# Patient Record
Sex: Female | Born: 1935 | Race: White | Hispanic: No | Marital: Married | Smoking: Former smoker
Health system: Southern US, Community
[De-identification: ages and names within clinical notes are randomized; demographics above are authoritative.]

## PROBLEM LIST (undated history)

## (undated) DIAGNOSIS — K5900 Constipation, unspecified: Secondary | ICD-10-CM

## (undated) DIAGNOSIS — B0223 Postherpetic polyneuropathy: Secondary | ICD-10-CM

## (undated) DIAGNOSIS — R339 Retention of urine, unspecified: Secondary | ICD-10-CM

## (undated) DIAGNOSIS — R1312 Dysphagia, oropharyngeal phase: Secondary | ICD-10-CM

## (undated) DIAGNOSIS — I2510 Atherosclerotic heart disease of native coronary artery without angina pectoris: Secondary | ICD-10-CM

## (undated) DIAGNOSIS — S62109A Fracture of unspecified carpal bone, unspecified wrist, initial encounter for closed fracture: Secondary | ICD-10-CM

## (undated) DIAGNOSIS — M1990 Unspecified osteoarthritis, unspecified site: Secondary | ICD-10-CM

## (undated) DIAGNOSIS — Z79899 Other long term (current) drug therapy: Secondary | ICD-10-CM

## (undated) DIAGNOSIS — R42 Dizziness and giddiness: Secondary | ICD-10-CM

## (undated) DIAGNOSIS — M5020 Other cervical disc displacement, unspecified cervical region: Secondary | ICD-10-CM

## (undated) DIAGNOSIS — R32 Unspecified urinary incontinence: Secondary | ICD-10-CM

## (undated) DIAGNOSIS — K5730 Diverticulosis of large intestine without perforation or abscess without bleeding: Secondary | ICD-10-CM

## (undated) DIAGNOSIS — I517 Cardiomegaly: Secondary | ICD-10-CM

## (undated) DIAGNOSIS — F0280 Dementia in other diseases classified elsewhere without behavioral disturbance: Secondary | ICD-10-CM

## (undated) DIAGNOSIS — M25569 Pain in unspecified knee: Secondary | ICD-10-CM

## (undated) DIAGNOSIS — J301 Allergic rhinitis due to pollen: Secondary | ICD-10-CM

## (undated) DIAGNOSIS — H9190 Unspecified hearing loss, unspecified ear: Secondary | ICD-10-CM

## (undated) DIAGNOSIS — K1321 Leukoplakia of oral mucosa, including tongue: Secondary | ICD-10-CM

## (undated) DIAGNOSIS — G309 Alzheimer's disease, unspecified: Secondary | ICD-10-CM

## (undated) DIAGNOSIS — I6789 Other cerebrovascular disease: Secondary | ICD-10-CM

## (undated) DIAGNOSIS — F332 Major depressive disorder, recurrent severe without psychotic features: Secondary | ICD-10-CM

## (undated) DIAGNOSIS — E1165 Type 2 diabetes mellitus with hyperglycemia: Secondary | ICD-10-CM

## (undated) DIAGNOSIS — F411 Generalized anxiety disorder: Secondary | ICD-10-CM

## (undated) DIAGNOSIS — K219 Gastro-esophageal reflux disease without esophagitis: Secondary | ICD-10-CM

## (undated) DIAGNOSIS — M545 Low back pain: Secondary | ICD-10-CM

## (undated) DIAGNOSIS — H3530 Unspecified macular degeneration: Secondary | ICD-10-CM

## (undated) DIAGNOSIS — G4700 Insomnia, unspecified: Secondary | ICD-10-CM

## (undated) DIAGNOSIS — E119 Type 2 diabetes mellitus without complications: Secondary | ICD-10-CM

## (undated) DIAGNOSIS — L280 Lichen simplex chronicus: Secondary | ICD-10-CM

## (undated) DIAGNOSIS — I10 Essential (primary) hypertension: Secondary | ICD-10-CM

## (undated) DIAGNOSIS — R609 Edema, unspecified: Secondary | ICD-10-CM

## (undated) DIAGNOSIS — E550 Rickets, active: Secondary | ICD-10-CM

## (undated) DIAGNOSIS — R12 Heartburn: Secondary | ICD-10-CM

## (undated) DIAGNOSIS — M810 Age-related osteoporosis without current pathological fracture: Secondary | ICD-10-CM

## (undated) DIAGNOSIS — Z9181 History of falling: Secondary | ICD-10-CM

## (undated) DIAGNOSIS — E7800 Pure hypercholesterolemia, unspecified: Secondary | ICD-10-CM

## (undated) DIAGNOSIS — L738 Other specified follicular disorders: Secondary | ICD-10-CM

## (undated) DIAGNOSIS — I319 Disease of pericardium, unspecified: Secondary | ICD-10-CM

## (undated) DIAGNOSIS — J690 Pneumonitis due to inhalation of food and vomit: Secondary | ICD-10-CM

## (undated) DIAGNOSIS — J449 Chronic obstructive pulmonary disease, unspecified: Secondary | ICD-10-CM

## (undated) DIAGNOSIS — M79609 Pain in unspecified limb: Secondary | ICD-10-CM

## (undated) DIAGNOSIS — R269 Unspecified abnormalities of gait and mobility: Secondary | ICD-10-CM

## (undated) DIAGNOSIS — D509 Iron deficiency anemia, unspecified: Secondary | ICD-10-CM

## (undated) DIAGNOSIS — S0181XA Laceration without foreign body of other part of head, initial encounter: Secondary | ICD-10-CM

## (undated) DIAGNOSIS — M6281 Muscle weakness (generalized): Secondary | ICD-10-CM

## (undated) HISTORY — DX: Fracture of unspecified carpal bone, unspecified wrist, initial encounter for closed fracture: S62.109A

## (undated) HISTORY — DX: History of falling: Z91.81

## (undated) HISTORY — DX: Major depressive disorder, recurrent severe without psychotic features: F33.2

## (undated) HISTORY — DX: Insomnia, unspecified: G47.00

## (undated) HISTORY — DX: Generalized anxiety disorder: F41.1

## (undated) HISTORY — DX: Heartburn: R12

## (undated) HISTORY — DX: Other long term (current) drug therapy: Z79.899

## (undated) HISTORY — PX: APPENDECTOMY: SHX54

## (undated) HISTORY — DX: Atherosclerotic heart disease of native coronary artery without angina pectoris: I25.10

## (undated) HISTORY — DX: Dizziness and giddiness: R42

## (undated) HISTORY — DX: Other specified follicular disorders: L73.8

## (undated) HISTORY — DX: Unspecified abnormalities of gait and mobility: R26.9

## (undated) HISTORY — DX: Retention of urine, unspecified: R33.9

## (undated) HISTORY — DX: Pain in unspecified knee: M25.569

## (undated) HISTORY — DX: Other cerebrovascular disease: I67.89

## (undated) HISTORY — DX: Lichen simplex chronicus: L28.0

## (undated) HISTORY — DX: Age-related osteoporosis without current pathological fracture: M81.0

## (undated) HISTORY — DX: Other cervical disc displacement, unspecified cervical region: M50.20

## (undated) HISTORY — DX: Unspecified macular degeneration: H35.30

## (undated) HISTORY — DX: Rickets, active: E55.0

## (undated) HISTORY — DX: Pneumonitis due to inhalation of food and vomit: J69.0

## (undated) HISTORY — DX: Pure hypercholesterolemia, unspecified: E78.00

## (undated) HISTORY — DX: Type 2 diabetes mellitus with hyperglycemia: E11.65

## (undated) HISTORY — DX: Muscle weakness (generalized): M62.81

## (undated) HISTORY — DX: Essential (primary) hypertension: I10

## (undated) HISTORY — DX: Allergic rhinitis due to pollen: J30.1

## (undated) HISTORY — DX: Type 2 diabetes mellitus without complications: E11.9

## (undated) HISTORY — DX: Unspecified urinary incontinence: R32

## (undated) HISTORY — DX: Laceration without foreign body of other part of head, initial encounter: S01.81XA

## (undated) HISTORY — DX: Edema, unspecified: R60.9

## (undated) HISTORY — DX: Iron deficiency anemia, unspecified: D50.9

## (undated) HISTORY — DX: Chronic obstructive pulmonary disease, unspecified: J44.9

## (undated) HISTORY — DX: Disease of pericardium, unspecified: I31.9

## (undated) HISTORY — DX: Pain in unspecified limb: M79.609

## (undated) HISTORY — DX: Gastro-esophageal reflux disease without esophagitis: K21.9

## (undated) HISTORY — DX: Alzheimer's disease, unspecified: G30.9

## (undated) HISTORY — DX: Unspecified hearing loss, unspecified ear: H91.90

## (undated) HISTORY — DX: Low back pain: M54.5

## (undated) HISTORY — DX: Constipation, unspecified: K59.00

## (undated) HISTORY — DX: Unspecified osteoarthritis, unspecified site: M19.90

## (undated) HISTORY — PX: TONSILLECTOMY: SUR1361

## (undated) HISTORY — DX: Dysphagia, oropharyngeal phase: R13.12

## (undated) HISTORY — DX: Cardiomegaly: I51.7

## (undated) HISTORY — PX: CARPAL TUNNEL RELEASE: SHX101

## (undated) HISTORY — DX: Dementia in other diseases classified elsewhere without behavioral disturbance: F02.80

## (undated) HISTORY — DX: Diverticulosis of large intestine without perforation or abscess without bleeding: K57.30

## (undated) HISTORY — PX: EYE SURGERY: SHX253

## (undated) HISTORY — DX: Leukoplakia of oral mucosa, including tongue: K13.21

## (undated) HISTORY — DX: Postherpetic polyneuropathy: B02.23

---

## 1997-11-11 ENCOUNTER — Other Ambulatory Visit: Admission: RE | Admit: 1997-11-11 | Discharge: 1997-11-11 | Payer: Self-pay | Admitting: Internal Medicine

## 1998-06-27 ENCOUNTER — Encounter: Payer: Self-pay | Admitting: Gastroenterology

## 1998-06-27 ENCOUNTER — Ambulatory Visit: Admission: RE | Admit: 1998-06-27 | Discharge: 1998-06-27 | Payer: Self-pay | Admitting: Gastroenterology

## 1998-11-16 ENCOUNTER — Encounter: Admission: RE | Admit: 1998-11-16 | Discharge: 1999-02-14 | Payer: Self-pay | Admitting: Internal Medicine

## 1999-01-08 ENCOUNTER — Other Ambulatory Visit: Admission: RE | Admit: 1999-01-08 | Discharge: 1999-01-08 | Payer: Self-pay | Admitting: Internal Medicine

## 1999-01-27 ENCOUNTER — Encounter: Admission: RE | Admit: 1999-01-27 | Discharge: 1999-01-27 | Payer: Self-pay | Admitting: Internal Medicine

## 1999-01-27 ENCOUNTER — Encounter: Payer: Self-pay | Admitting: Internal Medicine

## 1999-09-26 ENCOUNTER — Emergency Department (HOSPITAL_COMMUNITY): Admission: EM | Admit: 1999-09-26 | Discharge: 1999-09-26 | Payer: Self-pay | Admitting: Emergency Medicine

## 1999-10-13 ENCOUNTER — Emergency Department (HOSPITAL_COMMUNITY): Admission: EM | Admit: 1999-10-13 | Discharge: 1999-10-13 | Payer: Self-pay

## 1999-10-13 ENCOUNTER — Encounter: Payer: Self-pay | Admitting: Emergency Medicine

## 1999-12-02 ENCOUNTER — Emergency Department (HOSPITAL_COMMUNITY): Admission: EM | Admit: 1999-12-02 | Discharge: 1999-12-02 | Payer: Self-pay | Admitting: Emergency Medicine

## 2000-06-15 ENCOUNTER — Other Ambulatory Visit: Admission: RE | Admit: 2000-06-15 | Discharge: 2000-06-15 | Payer: Self-pay | Admitting: *Deleted

## 2000-11-27 ENCOUNTER — Encounter: Admission: RE | Admit: 2000-11-27 | Discharge: 2000-11-27 | Payer: Self-pay | Admitting: *Deleted

## 2001-12-24 ENCOUNTER — Other Ambulatory Visit: Admission: RE | Admit: 2001-12-24 | Discharge: 2001-12-24 | Payer: Self-pay | Admitting: Obstetrics and Gynecology

## 2002-01-09 ENCOUNTER — Encounter: Payer: Self-pay | Admitting: Obstetrics and Gynecology

## 2002-01-09 ENCOUNTER — Encounter: Admission: RE | Admit: 2002-01-09 | Discharge: 2002-01-09 | Payer: Self-pay | Admitting: Obstetrics and Gynecology

## 2002-04-15 DIAGNOSIS — L738 Other specified follicular disorders: Secondary | ICD-10-CM

## 2002-04-15 HISTORY — DX: Other specified follicular disorders: L73.8

## 2002-07-03 DIAGNOSIS — I10 Essential (primary) hypertension: Secondary | ICD-10-CM

## 2002-07-03 HISTORY — DX: Essential (primary) hypertension: I10

## 2003-03-01 HISTORY — PX: AXILLARY ARTERY - FEMORAL ARTERY BYPASS GRAFT: SUR177

## 2003-03-11 DIAGNOSIS — H9190 Unspecified hearing loss, unspecified ear: Secondary | ICD-10-CM | POA: Insufficient documentation

## 2003-03-11 HISTORY — DX: Unspecified hearing loss, unspecified ear: H91.90

## 2003-04-25 ENCOUNTER — Encounter: Admission: RE | Admit: 2003-04-25 | Discharge: 2003-04-25 | Payer: Self-pay | Admitting: Obstetrics and Gynecology

## 2003-07-16 DIAGNOSIS — H3530 Unspecified macular degeneration: Secondary | ICD-10-CM | POA: Insufficient documentation

## 2003-07-16 DIAGNOSIS — I2510 Atherosclerotic heart disease of native coronary artery without angina pectoris: Secondary | ICD-10-CM

## 2003-07-16 DIAGNOSIS — I6789 Other cerebrovascular disease: Secondary | ICD-10-CM

## 2003-07-16 DIAGNOSIS — J449 Chronic obstructive pulmonary disease, unspecified: Secondary | ICD-10-CM

## 2003-07-16 DIAGNOSIS — E7800 Pure hypercholesterolemia, unspecified: Secondary | ICD-10-CM

## 2003-07-16 DIAGNOSIS — F411 Generalized anxiety disorder: Secondary | ICD-10-CM

## 2003-07-16 DIAGNOSIS — K219 Gastro-esophageal reflux disease without esophagitis: Secondary | ICD-10-CM

## 2003-07-16 DIAGNOSIS — M810 Age-related osteoporosis without current pathological fracture: Secondary | ICD-10-CM

## 2003-07-16 DIAGNOSIS — M5020 Other cervical disc displacement, unspecified cervical region: Secondary | ICD-10-CM

## 2003-07-16 DIAGNOSIS — M1990 Unspecified osteoarthritis, unspecified site: Secondary | ICD-10-CM

## 2003-07-16 DIAGNOSIS — J4489 Other specified chronic obstructive pulmonary disease: Secondary | ICD-10-CM

## 2003-07-16 HISTORY — DX: Unspecified osteoarthritis, unspecified site: M19.90

## 2003-07-16 HISTORY — DX: Other specified chronic obstructive pulmonary disease: J44.89

## 2003-07-16 HISTORY — DX: Generalized anxiety disorder: F41.1

## 2003-07-16 HISTORY — DX: Pure hypercholesterolemia, unspecified: E78.00

## 2003-07-16 HISTORY — DX: Gastro-esophageal reflux disease without esophagitis: K21.9

## 2003-07-16 HISTORY — DX: Atherosclerotic heart disease of native coronary artery without angina pectoris: I25.10

## 2003-07-16 HISTORY — DX: Other cerebrovascular disease: I67.89

## 2003-07-16 HISTORY — DX: Age-related osteoporosis without current pathological fracture: M81.0

## 2003-07-16 HISTORY — DX: Unspecified macular degeneration: H35.30

## 2003-07-16 HISTORY — DX: Chronic obstructive pulmonary disease, unspecified: J44.9

## 2003-07-16 HISTORY — DX: Other cervical disc displacement, unspecified cervical region: M50.20

## 2003-08-19 ENCOUNTER — Ambulatory Visit (HOSPITAL_COMMUNITY): Admission: RE | Admit: 2003-08-19 | Discharge: 2003-08-20 | Payer: Self-pay | Admitting: Ophthalmology

## 2003-09-03 DIAGNOSIS — M79609 Pain in unspecified limb: Secondary | ICD-10-CM

## 2003-09-03 HISTORY — DX: Pain in unspecified limb: M79.609

## 2003-09-24 DIAGNOSIS — G4700 Insomnia, unspecified: Secondary | ICD-10-CM

## 2003-09-24 DIAGNOSIS — R42 Dizziness and giddiness: Secondary | ICD-10-CM

## 2003-09-24 HISTORY — DX: Dizziness and giddiness: R42

## 2003-09-24 HISTORY — DX: Insomnia, unspecified: G47.00

## 2003-10-02 ENCOUNTER — Ambulatory Visit (HOSPITAL_COMMUNITY): Admission: RE | Admit: 2003-10-02 | Discharge: 2003-10-02 | Payer: Self-pay | Admitting: Cardiovascular Disease

## 2003-10-07 ENCOUNTER — Ambulatory Visit (HOSPITAL_COMMUNITY): Admission: RE | Admit: 2003-10-07 | Discharge: 2003-10-07 | Payer: Self-pay | Admitting: Cardiovascular Disease

## 2003-10-20 ENCOUNTER — Inpatient Hospital Stay (HOSPITAL_COMMUNITY): Admission: AD | Admit: 2003-10-20 | Discharge: 2003-10-24 | Payer: Self-pay | Admitting: Cardiovascular Disease

## 2003-11-18 ENCOUNTER — Ambulatory Visit: Admission: RE | Admit: 2003-11-18 | Discharge: 2003-11-18 | Payer: Self-pay | Admitting: Vascular Surgery

## 2003-11-25 ENCOUNTER — Inpatient Hospital Stay (HOSPITAL_COMMUNITY): Admission: RE | Admit: 2003-11-25 | Discharge: 2003-12-01 | Payer: Self-pay | Admitting: Vascular Surgery

## 2004-01-01 ENCOUNTER — Ambulatory Visit (HOSPITAL_COMMUNITY): Admission: RE | Admit: 2004-01-01 | Discharge: 2004-01-01 | Payer: Self-pay | Admitting: Vascular Surgery

## 2004-01-16 DIAGNOSIS — M6281 Muscle weakness (generalized): Secondary | ICD-10-CM

## 2004-01-16 HISTORY — DX: Muscle weakness (generalized): M62.81

## 2004-02-03 ENCOUNTER — Inpatient Hospital Stay (HOSPITAL_COMMUNITY): Admission: AD | Admit: 2004-02-03 | Discharge: 2004-02-06 | Payer: Self-pay | Admitting: *Deleted

## 2004-02-03 ENCOUNTER — Encounter (INDEPENDENT_AMBULATORY_CARE_PROVIDER_SITE_OTHER): Payer: Self-pay | Admitting: *Deleted

## 2004-02-06 HISTORY — PX: COLONOSCOPY: SHX174

## 2004-02-16 ENCOUNTER — Other Ambulatory Visit: Admission: RE | Admit: 2004-02-16 | Discharge: 2004-02-16 | Payer: Self-pay | Admitting: *Deleted

## 2004-02-28 ENCOUNTER — Inpatient Hospital Stay (HOSPITAL_COMMUNITY): Admission: EM | Admit: 2004-02-28 | Discharge: 2004-03-09 | Payer: Self-pay | Admitting: Emergency Medicine

## 2004-08-18 DIAGNOSIS — M545 Low back pain, unspecified: Secondary | ICD-10-CM

## 2004-08-18 HISTORY — DX: Low back pain, unspecified: M54.50

## 2004-10-20 DIAGNOSIS — B0223 Postherpetic polyneuropathy: Secondary | ICD-10-CM

## 2004-10-20 HISTORY — DX: Postherpetic polyneuropathy: B02.23

## 2004-12-20 DIAGNOSIS — R609 Edema, unspecified: Secondary | ICD-10-CM

## 2004-12-20 HISTORY — DX: Edema, unspecified: R60.9

## 2005-01-03 ENCOUNTER — Encounter: Admission: RE | Admit: 2005-01-03 | Discharge: 2005-01-03 | Payer: Self-pay | Admitting: Internal Medicine

## 2005-02-16 ENCOUNTER — Other Ambulatory Visit: Admission: RE | Admit: 2005-02-16 | Discharge: 2005-02-16 | Payer: Self-pay | Admitting: Obstetrics and Gynecology

## 2005-06-13 DIAGNOSIS — IMO0001 Reserved for inherently not codable concepts without codable children: Secondary | ICD-10-CM

## 2005-06-13 HISTORY — DX: Reserved for inherently not codable concepts without codable children: IMO0001

## 2005-10-29 IMAGING — CR DG CHEST 2V
2 series · 2 of 2 positions shown · non-contrast
Comparison: none

CLINICAL DATA: 88-year-old female ? preadmission respiratory evaluation for cataract surgery.  History of smoking, positive PPD and hypertension.  
CHEST (TWO VIEWS) 08/15/03 AT 9192 HOURS

[view not recorded (1 of 2)]
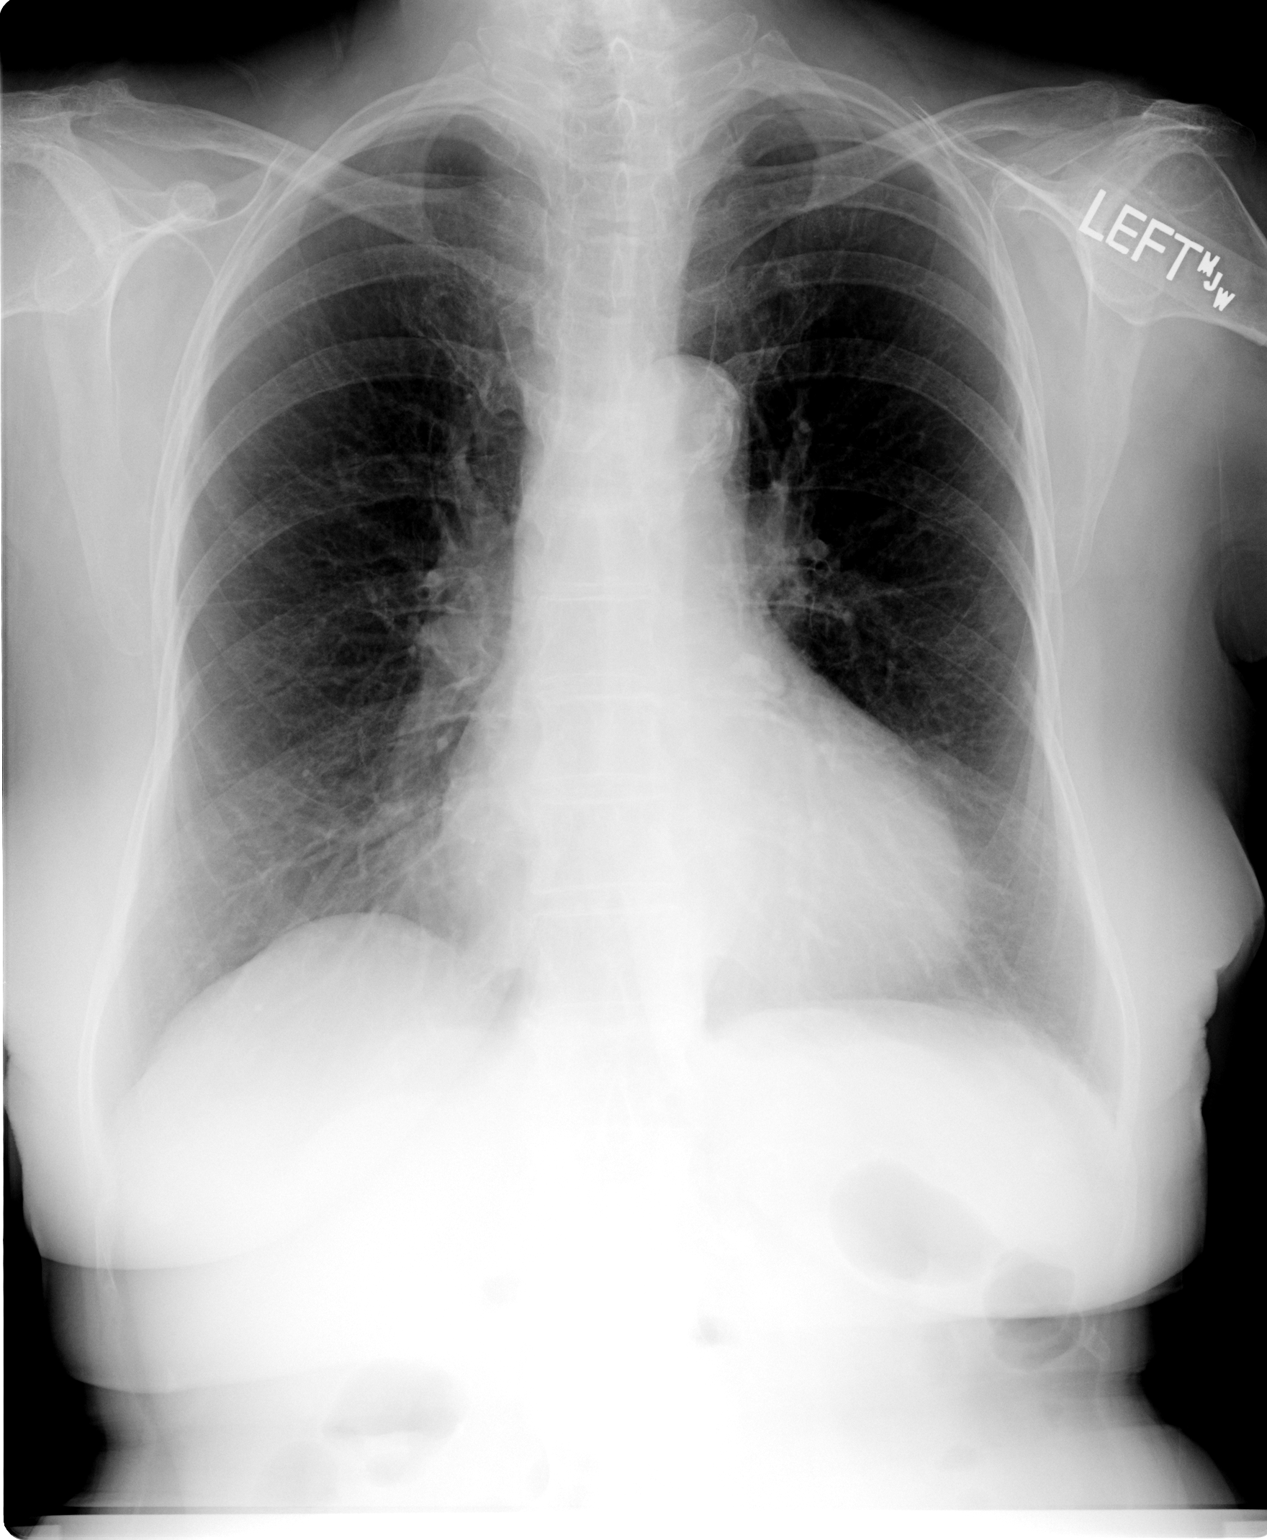

[view not recorded (2 of 2)]
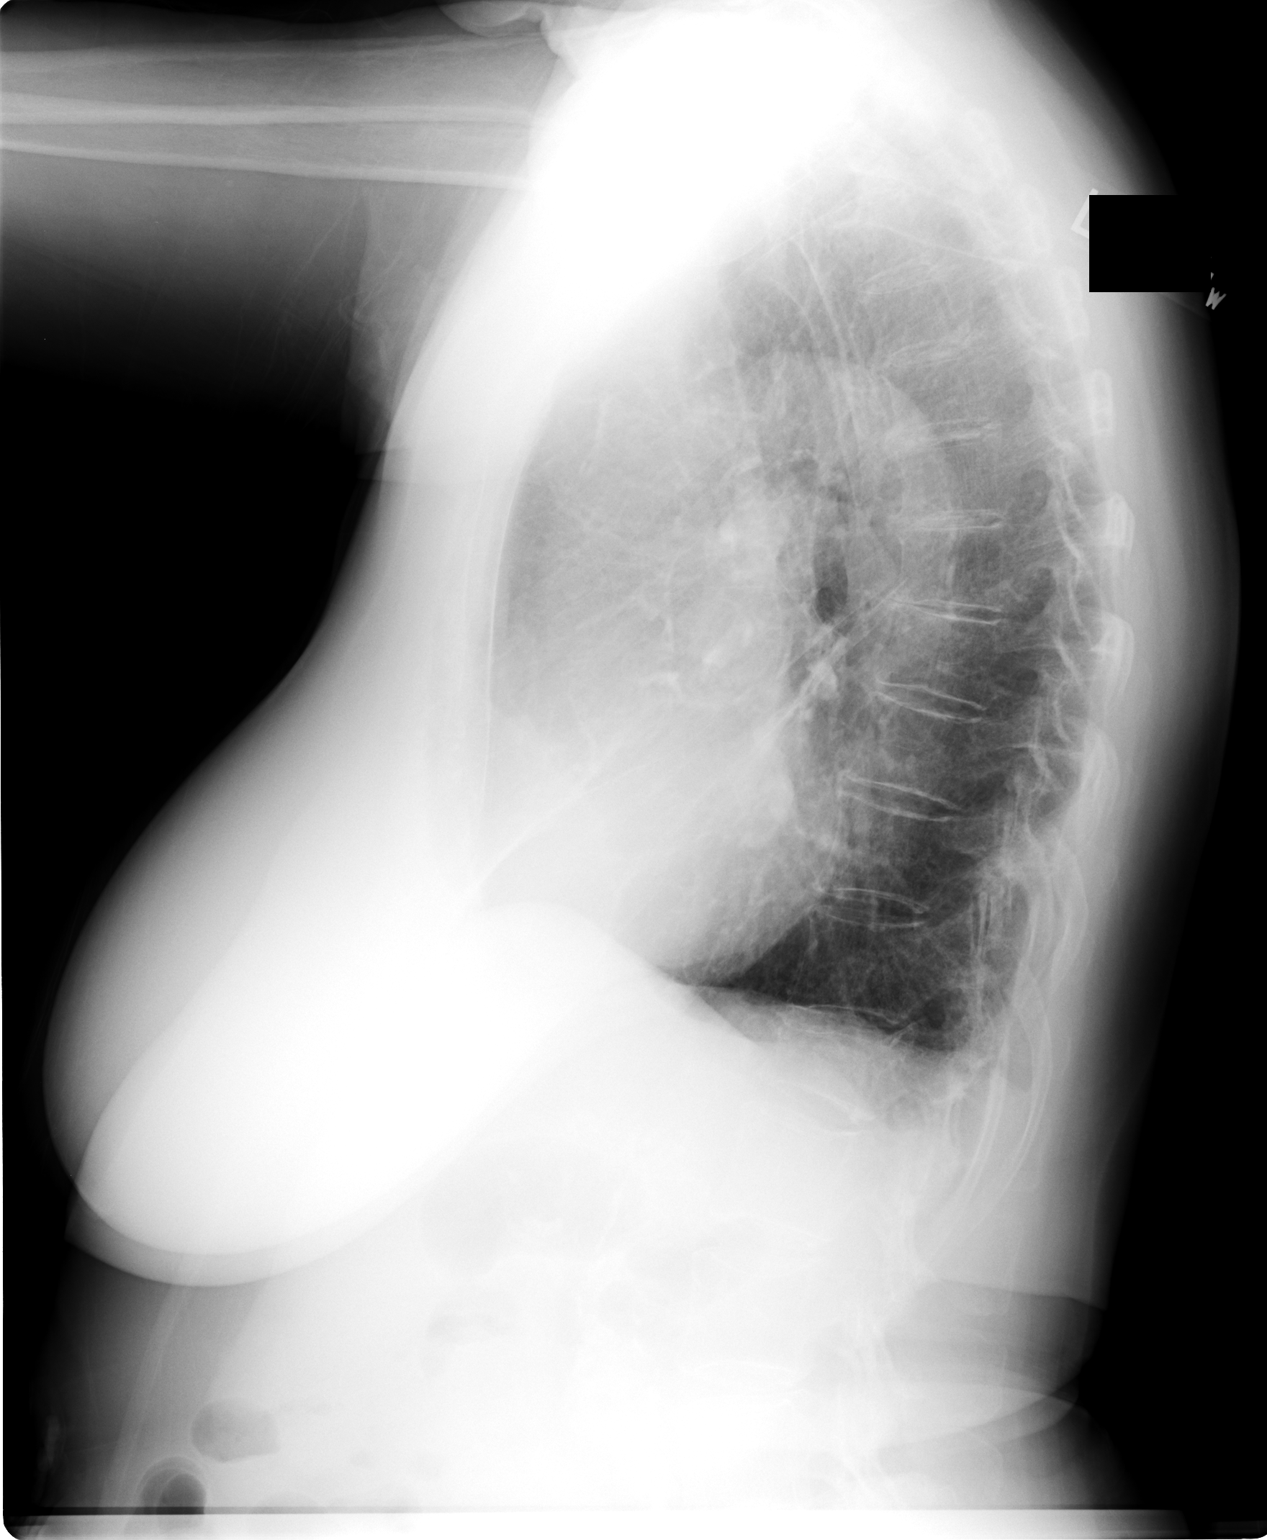

[2 of 2 positions shown; findings below may reference images not displayed]

FINDINGS: There is mild cardiac enlargement without CHF, pneumonia, effusion or pneumothorax.  Mild hyperinflation is evident of the chest consistent with background COPD.  Left infrahilar calcifications are suspected consistent with remote granulomatous disease. 
Degenerative changes are present in the thoracic spine with an L1 compression fracture, age indeterminate.  
IMPRESSION
COPD. 
Suspect remote granulomatous disease with calcified granulomas in the left infrahilar region. 
No acute airspace disease. 
L1 compression fracture, age indeterminate.

## 2005-12-04 ENCOUNTER — Emergency Department (HOSPITAL_COMMUNITY): Admission: EM | Admit: 2005-12-04 | Discharge: 2005-12-04 | Payer: Self-pay | Admitting: Family Medicine

## 2006-01-10 ENCOUNTER — Encounter: Admission: RE | Admit: 2006-01-10 | Discharge: 2006-01-10 | Payer: Self-pay | Admitting: Internal Medicine

## 2006-01-10 DIAGNOSIS — K5900 Constipation, unspecified: Secondary | ICD-10-CM

## 2006-01-10 DIAGNOSIS — R12 Heartburn: Secondary | ICD-10-CM

## 2006-01-10 HISTORY — DX: Heartburn: R12

## 2006-01-10 HISTORY — DX: Constipation, unspecified: K59.00

## 2006-02-17 ENCOUNTER — Other Ambulatory Visit: Admission: RE | Admit: 2006-02-17 | Discharge: 2006-02-17 | Payer: Self-pay | Admitting: Obstetrics & Gynecology

## 2006-02-20 DIAGNOSIS — K5730 Diverticulosis of large intestine without perforation or abscess without bleeding: Secondary | ICD-10-CM

## 2006-02-20 HISTORY — DX: Diverticulosis of large intestine without perforation or abscess without bleeding: K57.30

## 2006-04-03 DIAGNOSIS — F0280 Dementia in other diseases classified elsewhere without behavioral disturbance: Secondary | ICD-10-CM

## 2006-04-03 HISTORY — DX: Dementia in other diseases classified elsewhere, unspecified severity, without behavioral disturbance, psychotic disturbance, mood disturbance, and anxiety: F02.80

## 2006-12-18 DIAGNOSIS — F332 Major depressive disorder, recurrent severe without psychotic features: Secondary | ICD-10-CM

## 2006-12-18 HISTORY — DX: Major depressive disorder, recurrent severe without psychotic features: F33.2

## 2007-01-01 DIAGNOSIS — K1321 Leukoplakia of oral mucosa, including tongue: Secondary | ICD-10-CM

## 2007-01-01 HISTORY — DX: Leukoplakia of oral mucosa, including tongue: K13.21

## 2007-05-08 ENCOUNTER — Ambulatory Visit: Payer: Self-pay | Admitting: Vascular Surgery

## 2007-07-18 DIAGNOSIS — L280 Lichen simplex chronicus: Secondary | ICD-10-CM

## 2007-07-18 DIAGNOSIS — J301 Allergic rhinitis due to pollen: Secondary | ICD-10-CM

## 2007-07-18 DIAGNOSIS — R32 Unspecified urinary incontinence: Secondary | ICD-10-CM

## 2007-07-18 HISTORY — DX: Unspecified urinary incontinence: R32

## 2007-07-18 HISTORY — DX: Allergic rhinitis due to pollen: J30.1

## 2007-07-18 HISTORY — DX: Lichen simplex chronicus: L28.0

## 2008-02-08 DIAGNOSIS — E550 Rickets, active: Secondary | ICD-10-CM

## 2008-02-08 HISTORY — DX: Rickets, active: E55.0

## 2008-08-18 DIAGNOSIS — E119 Type 2 diabetes mellitus without complications: Secondary | ICD-10-CM

## 2008-08-18 HISTORY — DX: Type 2 diabetes mellitus without complications: E11.9

## 2009-06-17 DIAGNOSIS — M25569 Pain in unspecified knee: Secondary | ICD-10-CM

## 2009-06-17 HISTORY — DX: Pain in unspecified knee: M25.569

## 2009-07-29 DIAGNOSIS — Z9181 History of falling: Secondary | ICD-10-CM

## 2009-07-29 DIAGNOSIS — D509 Iron deficiency anemia, unspecified: Secondary | ICD-10-CM

## 2009-07-29 HISTORY — DX: Iron deficiency anemia, unspecified: D50.9

## 2009-07-29 HISTORY — DX: History of falling: Z91.81

## 2009-08-24 DIAGNOSIS — R269 Unspecified abnormalities of gait and mobility: Secondary | ICD-10-CM

## 2009-08-24 HISTORY — DX: Unspecified abnormalities of gait and mobility: R26.9

## 2010-03-21 ENCOUNTER — Encounter: Payer: Self-pay | Admitting: Obstetrics & Gynecology

## 2010-04-06 ENCOUNTER — Inpatient Hospital Stay (HOSPITAL_COMMUNITY)
Admission: EM | Admit: 2010-04-06 | Discharge: 2010-04-08 | DRG: 178 | Disposition: A | Discharge status: Skilled Nursing Facility | Payer: MEDICARE | Attending: Internal Medicine | Admitting: Internal Medicine

## 2010-04-06 ENCOUNTER — Emergency Department (HOSPITAL_COMMUNITY): Payer: MEDICARE

## 2010-04-06 DIAGNOSIS — F0390 Unspecified dementia without behavioral disturbance: Secondary | ICD-10-CM | POA: Diagnosis present

## 2010-04-06 DIAGNOSIS — R1310 Dysphagia, unspecified: Secondary | ICD-10-CM | POA: Diagnosis present

## 2010-04-06 DIAGNOSIS — Z87440 Personal history of urinary (tract) infections: Secondary | ICD-10-CM

## 2010-04-06 DIAGNOSIS — M810 Age-related osteoporosis without current pathological fracture: Secondary | ICD-10-CM | POA: Diagnosis present

## 2010-04-06 DIAGNOSIS — J690 Pneumonitis due to inhalation of food and vomit: Secondary | ICD-10-CM

## 2010-04-06 DIAGNOSIS — J9819 Other pulmonary collapse: Secondary | ICD-10-CM | POA: Diagnosis present

## 2010-04-06 DIAGNOSIS — Z7902 Long term (current) use of antithrombotics/antiplatelets: Secondary | ICD-10-CM

## 2010-04-06 DIAGNOSIS — I10 Essential (primary) hypertension: Secondary | ICD-10-CM | POA: Diagnosis present

## 2010-04-06 HISTORY — DX: Pneumonitis due to inhalation of food and vomit: J69.0

## 2010-04-06 LAB — DIFFERENTIAL
Basophils Absolute: 0 10*3/uL (ref 0.0–0.1)
Basophils Relative: 0 % (ref 0–1)
Lymphocytes Relative: 7 % — ABNORMAL LOW (ref 12–46)
Lymphs Abs: 1.1 10*3/uL (ref 0.7–4.0)
Neutro Abs: 14 10*3/uL — ABNORMAL HIGH (ref 1.7–7.7)
Neutrophils Relative %: 85 % — ABNORMAL HIGH (ref 43–77)

## 2010-04-06 LAB — COMPREHENSIVE METABOLIC PANEL
AST: 23 U/L (ref 0–37)
Albumin: 3.6 g/dL (ref 3.5–5.2)
BUN: 9 mg/dL (ref 6–23)
Calcium: 8.8 mg/dL (ref 8.4–10.5)
Chloride: 108 mEq/L (ref 96–112)
Creatinine, Ser: 0.96 mg/dL (ref 0.4–1.2)
GFR calc Af Amer: >60 mL/min (ref >60)
GFR calc non Af Amer: 54 mL/min — ABNORMAL LOW (ref >60)
Glucose, Bld: 147 mg/dL — ABNORMAL HIGH (ref 70–99)
Total Protein: 6.9 g/dL (ref 6.0–8.3)

## 2010-04-06 LAB — CBC
Hemoglobin: 15.3 g/dL — ABNORMAL HIGH (ref 12.0–15.0)
MCH: 30.3 pg (ref 26.0–34.0)
MCHC: 33.8 g/dL (ref 30.0–36.0)
MCV: 89.5 fL (ref 78.0–100.0)
Platelets: 346 10*3/uL (ref 150–400)
RBC: 5.05 MIL/uL (ref 3.87–5.11)
RDW: 14 % (ref 11.5–15.5)
WBC: 16.5 10*3/uL — ABNORMAL HIGH (ref 4.0–10.5)

## 2010-04-06 LAB — POCT CARDIAC MARKERS
CKMB, poc: 2 ng/mL (ref 1.0–8.0)
Myoglobin, poc: 63 ng/mL (ref 12–200)
Troponin i, poc: <0.05 ng/mL (ref 0.00–0.09)

## 2010-04-06 LAB — CARDIAC PANEL(CRET KIN+CKTOT+MB+TROPI)
CK, MB: 2.2 ng/mL (ref 0.3–4.0)
Relative Index: 0.3 (ref 0.0–2.5)
Total CK: 801 U/L — ABNORMAL HIGH (ref 7–177)
Total CK: 737 U/L — ABNORMAL HIGH (ref 7–177)
Troponin I: 0.02 ng/mL (ref 0.00–0.06)

## 2010-04-06 LAB — CK TOTAL AND CKMB (NOT AT ARMC)
CK, MB: 2.8 ng/mL (ref 0.3–4.0)
Relative Index: 0.4 (ref 0.0–2.5)

## 2010-04-06 LAB — TROPONIN I: Troponin I: 0.01 ng/mL (ref 0.00–0.06)

## 2010-04-07 DIAGNOSIS — I517 Cardiomegaly: Secondary | ICD-10-CM

## 2010-04-07 LAB — DIFFERENTIAL
Basophils Relative: 1 % (ref 0–1)
Eosinophils Absolute: 0.5 10*3/uL (ref 0.0–0.7)
Lymphs Abs: 1.6 10*3/uL (ref 0.7–4.0)
Monocytes Absolute: 0.9 10*3/uL (ref 0.1–1.0)
Monocytes Relative: 9 % (ref 3–12)

## 2010-04-07 LAB — COMPREHENSIVE METABOLIC PANEL
AST: 20 U/L (ref 0–37)
BUN: 9 mg/dL (ref 6–23)
CO2: 28 mEq/L (ref 19–32)
Calcium: 8.7 mg/dL (ref 8.4–10.5)
Chloride: 103 mEq/L (ref 96–112)
Creatinine, Ser: 0.93 mg/dL (ref 0.4–1.2)
GFR calc Af Amer: >60 mL/min (ref >60)
GFR calc non Af Amer: 56 mL/min — ABNORMAL LOW (ref >60)
Total Bilirubin: 0.8 mg/dL (ref 0.3–1.2)

## 2010-04-07 LAB — CBC
Hemoglobin: 13.8 g/dL (ref 12.0–15.0)
MCH: 29.6 pg (ref 26.0–34.0)
MCHC: 32.4 g/dL (ref 30.0–36.0)
MCV: 91.4 fL (ref 78.0–100.0)
Platelets: 306 10*3/uL (ref 150–400)
RBC: 4.66 MIL/uL (ref 3.87–5.11)

## 2010-04-08 LAB — CBC
HCT: 41 % (ref 36.0–46.0)
Hemoglobin: 13.5 g/dL (ref 12.0–15.0)
RBC: 4.53 MIL/uL (ref 3.87–5.11)
WBC: 11 10*3/uL — ABNORMAL HIGH (ref 4.0–10.5)

## 2010-04-09 DIAGNOSIS — R1312 Dysphagia, oropharyngeal phase: Secondary | ICD-10-CM

## 2010-04-09 HISTORY — DX: Dysphagia, oropharyngeal phase: R13.12

## 2010-04-11 NOTE — H&P (Signed)
NAMEMARITTA, KIEF NO.:  [id number]  MEDICAL RECORD NO.:  [id number]           PATIENT TYPE:  E  LOCATION:  [facility]                         FACILITY:  [facility]  PHYSICIAN:  Houston Siren, MD           DATE OF BIRTH:  1915/07/15  DATE OF ADMISSION:  04/06/2010 DATE OF DISCHARGE:                             HISTORY & PHYSICAL   PRIMARY CARE PHYSICIAN:  Lenon Curt. Chilton Si, MD  ADVANCE DIRECTIVE:  Full code.  This was reconfirmed tonight.  REASON FOR ADMISSION:  Chest pain and possible aspiration pneumonia.  HISTORY OF PRESENT ILLNESS:  This is a 77 year old female with history of dementia, nursing home resident, poor historian, blindness from macular degeneration, presents to the emergency room complaining of substernal chest pain.  Apparently at a nursing home, she was having chest pain and was given two sublingual nitroglycerins for which she has some improvement.  She denies any shortness of breath, but in the emergency room, she has nausea and vomiting.  Workup included an EKG, which showed Q-waves in II, III, and aVF, but without any acute changes. Her chest x-ray showed question of aspiration pneumonitis versus atelectasis or drug reaction event.  She has an elevated white count of 16,000.  Her liver function tests was normal.  Her blood glucose was 147 and her cardiac enzymes were negative.  Hospitalist was asked to admit the patient for rule out and to treat her potential aspiration pneumonia.  PAST MEDICAL HISTORY: 1. Hypertension. 2. Osteoporosis. 3. Recurrent UTIs. 4. Shingles. 5. Prior history of angina. 6. Dementia.  SOCIAL HISTORY:  She is a nursing home resident.  She is ambulatory and knows her family.  She denied tobacco, alcohol, or drug use.  Allergy to CODEINE and SULFA.  MEDICATIONS: 1. Namenda 10 mg b.i.d. 2. Calcium carbonate and vitamin D. 3. Klonopin 0.5 mg 1 b.i.d. 4. Plavix 75 mg per day. 5. Exelon 9.5 mg per 24 hours  transdermal. 6. Norvasc 5 mg per day.  PHYSICAL EXAMINATION:  VITAL SIGNS:  Blood pressure was 130/50, pulse of 80, respiratory rate is 16, temperature 97.7. GENERAL:  She is blind in both eyes.  She is alert and answers questions appropriately.  She has facial symmetry and her speech is fluent. Tongue is midline. NECK:  Supple. CARDIAC:  Revealed S1 and S2 regular. LUNGS:  Scattered rhonchi, but no wheezes or rales. ABDOMEN:  Obese, nondistended, nontender. EXTREMITIES:  No edema.  She is able to move all four extremities. SKIN:  Warm and dry. PSYCHIATRIC:  Unremarkable as well.  LABORATORY STUDY:  Potassium 3.4, glucose 147.  Liver function tests are normal.  Creatinine 0.96.  Chest x-ray shows aspiration pneumonitis or atelectasis.  White count of 16.5 thousand, hemoglobin of 15.3, platelet count 346 thousand.  EKG showed Q-wave in II, III, and aVF.  IMPRESSION:  This is a 77 year old nursing home resident with dementia, prior angina, presented with chest pain and is a vague historian.  We will rule her out with serial CPKs and troponins.  We will give aspirin along with her Plavix.  It is likely that she has aspiration pneumonia  from vomiting as well.  Her EKG showed inferior Qs and is consistent with inferior myocardial infarction of undetermined age.  I will continue all her medications as well.  We will treat her with vancomycin and Zosyn.  She is a full code, and I have confirmed this with her daughter.  She is stable.     Houston Siren, MD     PL/MEDQ  D:  04/06/2010  T:  04/06/2010  Job:  [id number]  cc:   Lenon Curt. Chilton Si, M.D.  Electronically Signed by Houston Siren  on 04/11/2010 05:40:10 AM

## 2010-05-06 NOTE — Discharge Summary (Signed)
NAMEROSLYN, ELSE NO.:  [id number]  MEDICAL RECORD NO.:  [id number]           PATIENT TYPE:  I  LOCATION:  2028                         FACILITY:  [facility]  PHYSICIAN:  [facility]      DATE OF BIRTH:  27-May-1915  DATE OF ADMISSION:  04/06/2010 DATE OF DISCHARGE:  04/08/2010                        DISCHARGE SUMMARY - REFERRING   DISCHARGE DIAGNOSES: 1. Probable aspiration pneumonia. 2. Chest pain -- resolved, ruled out for myocardial infarction by     enzymes, possibly secondary to number probable aspiration     pneumonia. 3. Dysphagia -- mechanical soft diet continued per family's wishes     with understanding of aspiration risk. 4. Hypertension. 5. Dementia. 6. Prior history of angina. 7. Osteoporosis. 8. History of recurrent urinary tract infections. 9. History of herpes zoster/shingles.  PROCEDURES AND STUDIES: 1. Chest x-ray, increased in linear basilar opacities especially on     the left, question atelectasis versus aspiration pneumonitis. 2. Echo, EF 60-65% wall motion normal.  No regional wall motion     abnormalities.  Aortic valve sclerosis without stenosis.  Doppler     parameters consistent with abnormal left ventricular relaxation --     grade 1 diastolic dysfunction.  BRIEF HISTORY:  The patient is a 77 year old resident of a nursing facility with the above-listed medical problems as well as blindness from macular degeneration who presented with complaints of substernal chest pain.  It was reported that she had been having chest pain at the nursing facility and she was given two sublingual nitroglycerins and improved with that.  She denied shortness of breath, but had nausea and vomiting in the ED.  An EKG was done and it showed Q-waves in II, III, and aVF but no acute changes.  Her chest x-ray showed question aspiration pneumonitis versus atelectasis or drug reaction.  Her white cell count was elevated at 16 and cardiac markers  were negative, she was admitted for further evaluation and management.  HOSPITAL COURSE: 1. Probable aspiration pneumonia -- as discussed above.  Chest x-ray     on admission showed increase in linear basilar opacities especially     on the left question of aspiration pneumonitis.  Her leukocytosis     improved from 16 on admission to 11 this morning on recheck and she     has remained afebrile and hemodynamically stable with no cough or     shortness of breath.  She had a swallow evaluation done this     hospital stay and the speech therapist indicated that the patient     demonstrated signs and symptoms of aspiration with all     consistencies tested and this was discussed with the patient's     daughter who stated that she desires for the patient to be fed even     with the known aspiration and that they do not desire a PEG tube or     modified barium swallow to be done.  The patient had been placed on     mechanical soft diet, which her daughter wants to be continued and     she was educated on aspiration precautions and  diet modifiers to     decrease the amount aspirated as well as the concept of comfort     feeds.  The patient is clinically improved at this time, has been     tolerating her diet and will be discharged on oral antibiotics     follow up with the nursing home MD. 2. Chest pain -- as discussed above, EKG in the ED showed Q-waves in     the inferior leads and the patient was placed on aspirin and Plavix     during this hospital stay and cardiac enzymes were cycled and came     back negative.  A 2-D echocardiogram was also done and the results     as stated above with no wall motion abnormalities and ejection     fraction 60-65%.  She has been chest pain free and will be     discharged at this time on her Plavix as previously to follow up     with her outpatient MD. 3. Dysphagia -- as discussed above, the patient has been tolerating     the mechanical soft diet as per  daughter's wishes even with the     understanding of aspiration risk. 4. Dementia -- she was maintained on Namenda and Exelon and is to     continue this upon discharge. 5. Hypertension -- the patient is to continue her Norvasc upon     discharge. 6. Her other chronic medical problems remained stable during this     hospital stay and she was maintained on her outpatient medications.  DISCHARGE MEDICATIONS: 1. Augmentin 875 mg one p.o. b.i.d. for 6 more days. 2. Tylenol 650 mg q.4 h. p.r.n. 3. Ensure 1 can t.i.d. 4. Multivitamins one p.o. daily. 5. Aller-Chlor 4 mg 1 tablet q.4 h. p.r.n. 6. Norvasc 5 mg p.o. q.a.m. 7. Caltrate 600/400 mg 1 p.o. b.i.d. 8. Saline mist spray 2 sprays in each nostril b.i.d. 9. Exelon 9.5 mg/24 hours, 1 patch q.a.m. 10.ICaps over-the-counter daily. 11.Klonopin 0.5 mg p.o. b.i.d. 12.Namenda 10 mg p.o. b.i.d. 13.Plavix 75 mg p.o. q.a.m. 14.Vitamin D 3000 units one p.o. q.a.m.  FOLLOWUP CARE:  Nursing home physician in 1-2 days.  Discharge condition -- improved/stable.  Discharge diet -- mechanical soft.     Kela Millin, M.D.   ______________________________ [facility]   ACV/MEDQ  D:  04/08/2010  T:  04/08/2010  Job:  [id number]  cc:   Lenon Curt. Chilton Si, M.D.  Electronically Signed by Donnalee Curry M.D. on 05/04/2010 07:32:30 PM

## 2010-07-16 NOTE — Op Note (Signed)
[name], PORE NO.:  [id number]   MEDICAL RECORD NO.:  [id number]          PATIENT TYPE:  INP   LOCATION:  5707                         FACILITY:  [facility]   PHYSICIAN:  Georgiana Spinner, M.D.    DATE OF BIRTH:  1916/02/22   DATE OF PROCEDURE:  02/03/2004  DATE OF DISCHARGE:                                 OPERATIVE REPORT   PROCEDURE:  Colonoscopy.   INDICATIONS FOR PROCEDURE:  GERD with hemoccult positivity, iron deficiency  anemia, upper gastrointestinal bleeding.   ANESTHESIA:  Demerol 40, Versed 4 mg.   PROCEDURE:  With the patient mildly sedated in the left lateral decubitus  position, the Olympus videoscopic endoscope was inserted in the mouth and  passed under direct vision through the esophagus which appeared normal and  into the stomach.  The fundus and body appeared normal.  The antrum showed  diffuse erythema that was photographed and biopsied.  We entered into the  duodenal bulb and second portion of the duodenum, both appeared normal.  From this point, the endoscope was slowly withdrawn, taking circumferential  views of the duodenal mucosa until the endoscope had been pulled back into  the stomach and placed in retroflexion to view the stomach from below.  The  endoscope was straightened and withdrawn taking circumferential views of the  remaining gastric and esophageal mucosa.  The patient's vital signs and  pulse oximeter remained stable.  The patient tolerated the procedure well  without apparent complications.   FINDINGS:  Erythema of the antrum, biopsied, await biopsy report.  The  patient will call me for results and follow up with me as an outpatient.  Proceed to colonoscopy as planned.       GMO/MEDQ  D:  02/03/2004  T:  02/03/2004  Job:  [id number]   cc:   Mariea Clonts.  [address]  [place]  [place] [address]  Fax: [phone]

## 2010-07-16 NOTE — Op Note (Signed)
NAMETESSICA, CUPO NO.:  [id number]   MEDICAL RECORD NO.:  [id number]          PATIENT TYPE:  INP   LOCATION:  5707                         FACILITY:  [facility]   PHYSICIAN:  Georgiana Spinner, M.D.    DATE OF BIRTH:  November 20, 1915   DATE OF PROCEDURE:  DATE OF DISCHARGE:                                 OPERATIVE REPORT   CONTINUATION:   MEDICATIONS:  Demerol 20 mg and Versed 2 mg.   DESCRIPTION OF PROCEDURE:  With the patient mildly sedated in the left  lateral decubitus position the Olympus videoscopic colonoscope was inserted  in the rectum and passed  and passed under direct vision to the sigmoid  colon at which point we encountered diffuse diverticulosis.  I could not get  above this area and elected therefore to stop.  I withdrew the colonoscope  taking circumferential views of th colonic mucosa, which otherwise appeared  normal.  We will get x-ray to rule out the possibility of a tear in one of  the diverticulum.       GMO/MEDQ  D:  02/03/2004  T:  02/03/2004  Job:  [id number]   cc:   Lenon Curt. Chilton Si, M.D.  [address]  [place]  [place] [address]  Fax: [phone]

## 2010-07-16 NOTE — H&P (Signed)
NAME:  [name], [name]                    ACCOUNT NO.:  [id number]   MEDICAL RECORD NO.:  [id number]                   PATIENT TYPE:  INP   LOCATION:  NA                                   FACILITY:  [facility]   PHYSICIAN:  Larina Earthly, M.D.                 DATE OF BIRTH:  01-10-1916   DATE OF ADMISSION:  11/18/2003  DATE OF DISCHARGE:                                HISTORY & PHYSICAL   PRIMARY CARE PHYSICIAN:  Maxwell Caul, M.D.   CARDIOLOGIST:  Richard A. Alanda Amass, M.D.   CHIEF COMPLAINT:  Claudication symptoms of bilateral lower extremities.   HISTORY OF PRESENT ILLNESS:  This is an 77 year old female referred by Dr.  Alanda Amass for evaluation of bilateral lower extremity claudication symptoms.  The onset of these symptoms began approximately 3 months ago in the  bilateral calves and thighs.  Symptoms presented with minimal exertion.  The  patient denies rest pain and night pain.  She has not had any ulcerations.  ABI's were performed and revealed 0.44 on the right and 0.48 on the left.  The patient had an outpatient CT angiogram performed by Dr. Alanda Amass  which  revealed high-grade stenosis of the left renal artery and severe calcific  distal aortic iliac disease.  Her Dobutamine Cardiolite was negative.  Dr.  Arbie Cookey was consulted regarding revascularization of the bilateral lower  extremities to alleviate the patient's symptoms.  The patient does  experience intermittent abdominal pain and nausea.  She denies vomiting,  hematochezia, back pain, hematemesis, peripheral edema, dysuria, hematuria,  GERD symptoms, angina and shortness of breath.  She does experience  constipation, claudication symptoms and muscle weakness.  The patient  presents to the CVTS office today for history and physical exam prior to  surgery.   PAST MEDICAL HISTORY:  1.  Arthritis.  2.  Chronic pulmonary obstructive disease.  3.  Hypertension.  4.  Hyperlipidemia.  5.  Leriche syndrome.  6.   Hiatal hernia.  7.  Diverticulitis.  8.  Macular degeneration.  9.  Transient ischemic attack approximately 4 years ago.   PAST SURGICAL HISTORY:  Appendectomy; tonsillectomy; carpal tunnel repair;  angioplasty in 2005; cataract removal, left eye; pessary placement.   MEDICATIONS:  1.  Norvasc 10 mg daily.  2.  Aspirin 81 mg daily.  3.  Celebrex 200 mg daily.  4.  Ocu-Vite 1 tablet b.i.d.  5.  Os-Cal-D 500 mg b.i.d.  6.  EB-Sus 60 mg daily.  7.  Plavix 75 mg daily.  8.  Protonix 40 mg daily.  9.  Toprol XL 50 mg 1/2 tablet q.a.m.  10. Lunesta 5 mg q.h.s.  11. Diovan 160 mg daily.  12. Lexapro 30 mg daily.   ALLERGIES:  MORPHINE, CODEINE, SULFA AND ULTRAM, all of which cause nausea  and vomiting.   REVIEW OF SYSTEMS:  Please see HPI for significant positives and negatives,  otherwise negative for diabetes mellitus,  kidney disease, amaurosis fugax  and cardiac disease.  Positive for TIA approximately 4 years ago.   FAMILY HISTORY:  Mother with diabetes mellitus and father with heart  disease.   SOCIAL HISTORY:  This is a married female with 3 children.  She has never  worked.  The patient drinks 2 glasses of wine per night and quit smoking in  2003 after smoking less than 1 pack per day for approximately 50 years.   PHYSICAL EXAMINATION:  VITAL SIGNS:  Blood pressure 120/58, pulse 62,  respirations 15.  GENERAL:  This is an 77 year old white female in no acute distress.  She is  alert and oriented x3.  HEENT:  Normocephalic, atraumatic.  Pupils were not equal, round and  reactive to light and accommodation.  Extraocular movements are intact.  There is no evidence of cataracts or glaucoma.  The patient does have  bilateral macular degeneration.  NECK:  Supple with no JVD, no bruits, no lymphadenopathy.  CHEST:  Symmetrical on inspiration.  LUNGS:  No wheezes, rhonchi or rales.  CARDIAC:  Regular rate and rhythm with no murmurs, rubs or gallops.  ABDOMEN:  Soft, nontender  and there are active bowel sounds auscultation in  all 4 quadrants, there are no masses, no bruits.  GU/RECTAL:  Deferred.  EXTREMITIES:  No clubbing, cyanosis or edema, no ulcerations. Temperature is  decreased in her feet.  PERIPHERAL PULSES:  Carotids are 2+ bilaterally.  Femoral and distal pulses  are not palpable.  NEUROLOGIC EXAM:  Nonfocal.  The gait is steady with deep tendon reflexes 2+  and muscle strength is 5/5.   ASSESSMENT:  Bilateral lower extremity peripheral vascular disease.   PLAN:  Axillofemoral and fem-fem bypass graft to be performed by Dr. Arbie Cookey  at [facility] on November 18, 2003.  Dr. Arbie Cookey has seen and evaluated this  patient prior to this admission and has explained the risk and benefits of  the procedure and the patient has agreed to continue.       AY/MEDQ  D:  11/14/2003  T:  11/15/2003  Job:  [id number]

## 2010-07-16 NOTE — Consult Note (Signed)
[name] NO.:  [id number]   MEDICAL RECORD NO.:  [id number]          PATIENT TYPE:  INP   LOCATION:  5707                         FACILITY:  [facility]   PHYSICIAN:  Emily Heckler, MD      DATE OF BIRTH:  20-Mar-1915   DATE OF CONSULTATION:  02/04/2004  DATE OF DISCHARGE:                                   CONSULTATION   Referring physician:  Georgiana Wright, M.D.  Primary physician:  [name], M.D.   REASON FOR CONSULTATION:  Perforated sigmoid diverticulum due to  colonoscopy.   HISTORY OF PRESENT ILLNESS:  The patient is an 77 year old white female  admitted urgently to [facility] following complicated colonoscopy  today.  Dr. Virginia Wright had been performing upper endoscopy and colonoscopy for  evaluation of Hemoccult-positive stools.  The patient had a longstanding  history of diverticular disease for 15 years.  During the endoscopic  procedure, significant diverticular disease was encountered.  The scope was  unable to be advanced past the sigmoid colon.  Abdominal distention was  noted and abdominal x-ray was obtained, which confirmed pneumoperitoneum.  Dr. Virginia Wright suspects a perforated sigmoid diverticulum.  The patient is admitted  on his service for bowel rest and intravenous antibiotic therapy.  General  surgery is asked to evaluate in consultation.   The patient denies any recent problems with acute diverticulitis.  She has  had no prior colonic surgical interventions.  She has had a previous  appendectomy.  The patient underwent recent extra-anatomic bypass from the  right axillary artery to the right femoral artery and then a femoral-femoral  bypass for aortic occlusive disease.  This was performed September 2005 by  Dr. Gretta Wright.   PAST MEDICAL HISTORY:  1.  History of hypertension.  2.  History of degenerative joint disease.  3.  History of gastroesophageal reflux disease.  4.  History of aorto-occlusive disease, status post  extra-anatomic bypass      September 2005 by Dr. Gretta Wright.  5.  History of macular degeneration.  6.  History of diverticular disease x15 years.  7.  Status post appendectomy.  8.  Status post tonsillectomy.  9.  Status post carpal tunnel release.   MEDICATIONS:  Protonix, iron sulfate Lexapro, Norvasc, Diovan, Phenergan,  Arthrotec, Ocuvite, Plavix, metoprolol, Evista, Lunesta, Os-Cal, Ecotrin.   ALLERGIES:  CODEINE, SULFA, and ULTRAM, all causing nausea and vomiting.   SOCIAL HISTORY:  The patient lives at [place] with her husband.  She is  accompanied today by her daughter.  She denies any tobacco use.  She denies  any alcohol use.   FAMILY HISTORY:  Noncontributory.   A 15-system review without significant other findings except as noted above.   PHYSICAL EXAMINATION:  GENERAL:  An 77 year old thin white female on [address], [place].  VITAL SIGNS:  Temperature 97.5, pulse 65, respirations 18, blood pressure  119/65, O2 saturation 95% on room air.  HEENT:  Normocephalic, atraumatic.  Sclerae are clear.  The pupils are equal  and reactive.  Dentition is fair.  Mucous membranes are moist.  Voice is  normal.  NECK:  Anterior examination of the neck shows it to be symmetric.  Palpation  reveals no mass or tenderness.  Thyroid is without nodularity.  There is no  lymphadenopathy.  CHEST:  Lungs are clear to auscultation bilaterally without rales, rhonchi,  or wheeze.  There is a healing surgical wound in the right subclavian  position on the chest wall.  CARDIAC:  Regular rate and rhythm without significant murmur.  Peripheral  pulses are full in the upper extremities, and lower extremities are warm to  the feet and ankles.  ABDOMEN:  Soft, protuberant, with bowel sounds present on auscultation.  There is mild tympany in all four quadrants.  There was no tenderness to  palpation or percussion.  There is no guarding.  There is no rebound  tenderness.  There is a  well-healed right lower quadrant wound.  There are  well-healed bilateral groin wounds, which are clean and dry.  EXTREMITIES:  Nontender without edema.  NEUROLOGIC:  The patient has markedly diminished visual capabilities.  She  is slightly hard of hearing.  She has no focal neurologic deficits.   LABORATORY DATA:  White count 7.9, hemoglobin 11.1, platelet count 308,000.  Electrolytes are normal.  Liver function tests are normal.   RADIOGRAPHIC STUDIES:  Abdominal x-ray with left lateral decubitus view  shows pneumoperitoneum.   IMPRESSION:  Perforated sigmoid diverticulum due to colonoscopy.   PLAN:  The patient is admitted on the gastroenterology service.  She is  placed at bowel rest with intravenous hydration and intravenous antibiotics.  The patient will be maintained n.p.o.  She is receiving Cipro and Flagyl.  Laboratory studies will be repeated on December 7.  She will undergo serial  abdominal examination.  I discussed at length with the patient and the  daughter the options for management.  This included immediate operative  intervention with exploratory laparotomy and possible sigmoid colectomy and  probable colostomy placement.  Alternative to immediate surgical  intervention would be bowel rest with intravenous antibiotics and close  observation.  The patient and her family elect the observation mode at the  present time.  I quoted them  approximately a 50/50 chance of success of nonoperative management.  I  explained to them that if her clinical course deteriorated in any way, she  would require exploratory laparotomy and colostomy placement.  They  understand.  We will follow her closely in conjunction with [name].      [name]  D:  02/03/2004  T:  02/04/2004  Job:  [id number]   cc:   [name], M.D.  [address]  [place]  [place] [address]  Fax: [phone]   [name], M.D. [address]  [place]  [place] [address]  Fax:  [phone]

## 2010-07-16 NOTE — Op Note (Signed)
[name], LIKE NO.:  [id number]   MEDICAL RECORD NO.:  [id number]          PATIENT TYPE:  INP   LOCATION:  2309                         FACILITY:  [facility]   PHYSICIAN:  Larina Earthly, M.D.    DATE OF BIRTH:  30-Jun-1915   DATE OF PROCEDURE:  11/25/2003  DATE OF DISCHARGE:                                 OPERATIVE REPORT   DATE OF PROCEDURE:  November 25, 2003.   PREOPERATIVE DIAGNOSIS:  Severe bilateral lower extremity arterial  insufficiency.   POSTOPERATIVE DIAGNOSIS:  Severe bilateral lower extremity arterial  insufficiency.   PROCEDURE:  Right axillobifemoral bypass with 8 mm Hemashield graft.   SURGEON:  Larina Earthly, MD.   ASSISTANTDi Kindle. Edilia Bo, MD., and Jerold Coombe, PA-C.   ANESTHESIA:  LMA.   COMPLICATIONS:  None.   DISPOSITION:  To recovery room, stable.   PROCEDURE IN DETAIL:  The patient was taken to the operating room and placed  in the prone position where attempts at general endotracheal anesthesia were  attempted.  The patient had an anterior small airway and therefore, despite  use of fiberoptic bronchoscopy, the patient had difficulty being intubated,  and therefore, LMA anesthesia was used.  This was without complication.  The  right chest, right neck, right abdomen, and right and left groins down to  the level of the thigh were prepped and draped in the usual sterile fashion.  An incision was made below the level of the right clavicle and carried down  to isolate the axillary vein and axillary artery.  Tributary branches of the  axillary vein were ligated and divided.  The vein was mobilized superiorly,  and the axillary artery was encircled with the blue vessel loop.  This was  good caliber with minimal atherosclerotic change.  Separate incisions were  made over each groin and carried down to isolate the common, superficial  femoral, and profundus femoris arteries bilaterally.  These were with  significant  atherosclerotic plaque, but were patent.  A tunnel was created  from the level of the right groin to the right axilla, taking care to pass  behind the level of the pectoralis major muscle.  An 8 mm Dacron graft  Hemashield was brought through the tunnel.  Next, using a curved gore  tunneler, a tunnel was created from the left groin to the right groin in a C-  configuration above the pubic symphysis.  An 8 mm Hemashield graft was  brought through this as well.  The patient was given 7000 units of  intravenous heparin.  After adequate circulation time, the right axillary  artery was occluded proximally and distally, it was opened with a #11 blade  and extended with Potts scissors.  The graft was slightly spatulated and  sewn end-to-side to the artery with a running 5-0 Prolene suture.  Clamps  were placed on the graft, and this was tested and found to be an adequate  anastomosis.  The graft was then pressurized to the level of the right  groin.  The right common, superficial femoral, and profundus femoris  arteries were occluded.  The common femoral artery was opened with a #11  blade to the junction of the superficial femoral artery takeoff with Potts  scissors.  The graft was cut to appropriate length, spatulated, and sewn end-  to-side to the underside of the artery with a running 5-0 Prolene suture.  This anastomosis was tested and found to be adequate, and the anastomosis  was completed.  Next, the left common, superficial femoral, and profundus  femoris arteries were occluded, and the common femoral artery was opened  with a #11 blade and extended with Potts scissors.  The graft was an 8-mm  graft, was slightly spatulated, and sewn end-to-side to the artery with a  running 5-0 Prolene suture.  The anastomosis was tested and found to be  adequate.  Finally, the ax-fem graft was reoccluded proximally and distally,  and the fem-fem graft was brought to the appropriate position on the  ax-fem  bypass.  An ellipse of graft was removed at the appropriate position, the  fem-fem graft cut to the appropriate length and was sewn end-to-side to the  ax-fem with a running 5-0 Prolene suture.  Clamps were removed, and  excellent flow was noted.  The wounds were irrigated with saline.  Hemostasis was obtained with electrocautery.  The patient was given 50 mg of  Protamine to reverse the heparin.  The wounds were closed with 2-0 Vicryl in  several layers in the subcutaneous tissue, and the skin was closed with a 4-  0 subcuticular Vicryl suture.  Benzoin and Steri-Strips were applied.  The  patient was awakened in the operating room, the LMA was removed, and the  patient was transferred to the recovery room in stable condition.      Todd   TFE/MEDQ  D:  11/25/2003  T:  11/25/2003  Job:  [id number]   cc:   Gerlene Burdock A. Alanda Amass, M.D.  [phone], [address]  [place]  [place] [address]  Fax: [phone]

## 2010-07-16 NOTE — Op Note (Signed)
NAME:  [name], WICKHAM                    ACCOUNT NO.:  [id number]   MEDICAL RECORD NO.:  [id number]                   PATIENT TYPE:  OIB   LOCATION:  6523                                 FACILITY:  [facility]   PHYSICIAN:  Richard A. Alanda Amass, M.D.          DATE OF BIRTH:  [birth year]   DATE OF PROCEDURE:  10/20/2003  DATE OF DISCHARGE:                                 OPERATIVE REPORT   PROCEDURE:  Attempted retrograde abdominal aortic catheterization via right  common femoral artery approach, antegrade abdominal aortic catheterization  via percutaneous left brachial approach, right lower extremity run off via  side arm sheath, abdominal aortic angiogram PA and lateral projection, left  renal angiogram and trans-stenotic gradient measurement, left renal artery  nonostial PTRA.   DESCRIPTION OF PROCEDURE:  The patient was admitted as a same day admission  with preoperative laboratory showing BUN 12 and creatinine 0.8,  normal  coags, hyperlipidemia, and chronic CPK elevation with negative MB.  Both  groins were prepped and draped in the usual manner.  The CFRA was only  faintly palpable and by clinical examination was felt to probably represent  collateral flow with, most likely, Leriche syndrome.  The CSFRA was entered  with an 18 thin wall needle and under fluoroscopic control, a Wooley wire  was advanced to the RCIA.  An Entall catheter was inserted over this and  some probing with the Wooly wire and then a 0.035 inch glide wire was  attempted to cross the subtotal right common iliac stenosis after hand  injection of the right iliacs was done.  There was some dye staining  compatible with focal dissection so this was abandoned.  The sheath was left  in place and right lower extremity run off was done through the sidearm  sheath at 40 mL, 8 mL per second, with DSA with visualization to the right  foot.  We then approached the left brachial artery.  Ulnar and radial pulses  were  intact with negative Balen's test indicating good ulnar flow.  The  brachial artery was palpable and the left upper extremity was prepped and  draped in the usual manner.  Using an 18 thin wall needle, the left brachial  artery was entered and a Wooly wire was used to traverse the axillary  subclavian system.  A 5 French sheath was inserted short brachial and the  patient was given 4000 units of heparin and, later in the procedure, given  another 1500 units of heparin monitoring ACTs.  The initial 4000 units were  given intra-arterially along with 200 mcg of nitroglycerin x 2.  A 5 French  pigtail catheter was advanced over the wire under fluoroscopic control and  entered the ascending aorta.  This was positioned above the level of the  renal arteries and abdominal angiogram was done at in the PA and lateral  projections at 25 mL, 20 mL per second, with DSA.  Selective left renal  angiography  was done with a 6 [place] sheath and 6 [place] multi-purpose long  catheter.   Arterial pressures were monitored throughout the procedure and ranged from  190 to 200 mmHg.  The patient was in sinus rhythm throughout the procedure.  Right iliac angiography through the sidearm sheath revealed diffuse disease  of the right common and external iliac.  The hypogastric was not seen and  was probably occluded.  There was diffuse calcific disease, there was  probably 80% or greater segment of disease throughout the common iliac.  There was also 75-85% diffuse disease of the right common femoral.  The  right SFA profunda junction was intact.  There was only a trickle of flow on  DSA imaging across the iliac bifurcation from the right to the left.  The  right lower extremity run off demonstrated diffuse SFA disease with tandem  75-85% proximal stenosis, 85-95% segmental stenosis at Hunter's canal, and  40-50% narrowing of the right popliteal.  There was three vessel run off  with the dominant anterior tibial with  diffuse disease but no high grade  stenosis of the tibial vessels and good run off to the right foot.  The  profunda was intact and there were profunda collaterals.   Abdominal angiography in the PA and lateral projections demonstrated the  widely patent celiac and SMA.  There were extensive lumbar collaterals and  an essentially functionally occluded distal abdominal aorta below the IMA.  The IMA was not visualized.  There were lumbar collaterals and middle sacral  collaterals.  There was some very faint antegrade filling of the highly  calcific 99% stenosis of the LCIA at the bifurcation but there was some  faint antegrade filling of the left iliac system which was diffusely  diseased and calcific but approximately 50% throughout, the left common  femoral was fairly good, however, and the left profunda was intact with  overlap.  The infrarenal abdominal aortic aneurysm had diffuse, calcific  atherosclerotic disease with essential Leriche findings as outlined above.  The diseased aorta extended to just below the left renal artery where there  was tandem aneurysmal dilatation.  There was atherosclerotic disease in the  suprarenal position but no aneurysmal dilatation.   In view of the patient's severe hypertension and already obtaining left  upper extremity access, it was elected to proceed with elective left renal  angiography.  The right renal artery was single with no significant  stenosis.  The left renal artery had nonostial high grade, greater than 95%  stenosis.  There was ostial calcification without ostial stenosis at the  funnel.  The ACT was therapeutic and it was elected to proceed with PTRA  at this point for nonostial stenosis in the setting of severe hypertension.   The left renal artery was crossed with a 0.014 Cordis stabilizer wire.  We  then conservatively passed a 4 mm x 15 mm Cordis Aviator balloon over the wire through the multipurpose guide and dilated the stenosis  at 10-45 and 10-  30.  There was greater than 100 mm gradient in this area predilatation.  There was fairly good balloon contour with dilatation.  Final injections  after the balloon was pulled back showed stenosis reduction from greater  than 95% to approximately 50% much improved lumen.  There was a calcific  shelf between the funnel of the artery but improved flow.  It was felt that  although this was not ideal, there was no dissection, good flow and  considerable improvement,  so we did not dilate any further for risk of renal  and/or aortic dissection in this area.  The dilatation system was then  removed.  The side-arm sheath was flushed.  The patient was transferred to  the holding area for postoperative care in stable condition.  She received  intermittent Nubain, a total of 6 mg during the procedure in divided doses,  intra-arterial nitroglycerin 400 mcg.  The final ACT was 235 seconds.  We  plan to pull her right femoral and left brachial sheath with pressure  hemostasis.   DISCUSSION:  This 34 year old married mother of three with nine  grandchildren lives in [place].  She has a history of COPD and quit  smoking two years ago, systemic hypertension.  She had the onset of severe  bilateral thigh and calf claudication of almost three months now with a  fairly sudden onset.  She is actually able to date this onset to walking up  some steps while visiting a daughters vacation home in [place] almost three  months ago.  Since that time, she has had severe bilateral thigh and calf  claudication on minimal exertion of less than 25 yards.  She has not had any  significant rest pain, there has been no ulcerations.  She has not had  angina or symptoms to suggest ischemia.  She has had significant  hyperlipidemia, was on Zocor, but had an elevated CPK and was taken off this  presumably for this reason and/or lower extremity weakness which, from a  clinical standpoint, appears to be  related to her claudication and not her  statin therapy.  Also, her CPK may well be related to her Leriche syndrome  of the abdominal aorta.  Outpatient evaluation preoperatively because of the  patient's advanced age, showed severely abnormal ABIs with RABI of 0.44,  LABI of 0.48, suspected bilateral iliac occlusion.  She had an outpatient CT  angiogram showing high grade stenosis of the left renal artery, patent  celiac SMA and probably IMA and severe calcific distal aortoiliac disease  compatible with the angiographic findings.  She has also had a negative  Dobutamine Cardiolite for ischemia and has not had any symptoms of angina.  The left kidney measured 9.9 cm, the right kidney 10.2 cm, on ultrasound.  Upper GI by Dr. Chilton Si showed presbyesophagus with hiatal hernia and  Cardiolite of December 2003 showed no ischemia.  Sed rate was 5.  There were  upper extremity bruits but no significant pulse deficit to suggest significant subclavian stenosis or cerebral symptoms of steal.   This patient has a severe functional calcific aortoiliac occlusion with  severe disease of her distal abdominal aorta.  Patent celiac, SMA, and  probably IMA.  She has had successful PTRA of a high grade left renal artery  stenosis in the setting of severe renal vascular hypertension for purposes  of renal preservation and hypertension.  She has diffuse atherosclerotic  disease extending to near the left renal artery.  From an anatomic  standpoint, she might be a candidate for an aorto-bi-femoral bypass.  We  were not able to get adequate visualization to the left lower extremity but  the proximal vessels appear intact and right lower extremity angiography is  as outlined above with three vessel run off.  Comorbid significant  conditions include long history of cigarette abuse, advanced age of 77.  She  has, however, despite her being hard of hearing and visual problems  (cataract surgery June 2005) she had  previously been ambulatory and active.  From an interventional standpoint, she is not a candidate for any  intervention and we will get cardiovascular and peripheral vascular surgical  consultation for further opinion regarding the possibility of surgical  revascularization in this high risk setting.   CATHETERIZATION DIAGNOSIS:  1. Bilateral severe lower extremity claudication, abrupt onset 2-3 months     ago.  2. Leriche syndrome with subtotal occlusion distal abdominal aorta and only     trickle flow to iliacs bilaterally; high grade right common and external     iliac and right CFA stenosis; moderate LEIA narrowing and patent LCFA.  3. Diffuse right SFA disease with three vessel run off.  4. Intact profundas bilaterally with no significant proximal left SFA     stenosis (distal vessels not visualized).  5. Renovascular hypertension with high grade nonostial left renal artery     stenosis treated with PTRA.  6. Elevated CPK possibly related to lower extremity ischemia, cannot rule     out Zocor related, normal LFTs.  7. Patent celiac, SMA, and possibly IMA.  8. Presbyesophagus, minimal reflux symptoms.  9. COPD.  10.      Hyperlipidemia.  11.      Dobutamine Cardiolite December 2003 with no history of angina.                                               Richard A. Alanda Amass, M.D.    RAW/MEDQ  D:  10/20/2003  T:  10/20/2003  Job:  [id number]   cc:   Lenon Curt. Chilton Si, M.D.  [address]  [place]  [place] [address]  Fax: [phone]   Richardean Sale, M.D.  [address]  [place]  [place] [address]  Fax: [phone]   Vania Rea. Jarold Motto, M.D. [facility]

## 2010-07-16 NOTE — Discharge Summary (Signed)
NAME:  [name], [name]                    ACCOUNT NO.:  [id number]   MEDICAL RECORD NO.:  [id number]                   PATIENT TYPE:  INP   LOCATION:  3701                                 FACILITY:  [facility]   PHYSICIAN:  Richard A. Alanda Amass, M.D.          DATE OF BIRTH:  1915-06-26   DATE OF ADMISSION:  10/20/2003  DATE OF DISCHARGE:  10/24/2003                                 DISCHARGE SUMMARY   ADMITTING DIAGNOSES:  1.  Severe lower extremity claudication, lifestyle limiting.  2.  Systemic hypertension.  3.  Chronic obstructive pulmonary disease with discontinued smoking two      years ago.  4.  Presbyesophagus mild reflux.  5.  Anxiety and history of depression on antidepressants.  6.  Hard of hearing.  7.  Macular degeneration.  8.  Hyperlipidemia with history of elevated CKs on Zocor, so therefore Zocor      recently discontinued.  9.  No history of coronary artery disease.  History of negative dobutamine      Cardiolite December 2003.  10. Diverticulosis.  11. History of transient ischemic attack.  12. Recurrent urinary tract infection.   DISCHARGE DIAGNOSES:  Leriche syndrome.   HISTORY OF PRESENT ILLNESS:  [name] is an 77 year old married white  female who has been seeing Dr. Alanda Amass for evaluation of leg pain.  She  had outpatient Dopplers which were incomplete, but did show severe reduction  in ABIs bilaterally with an R ABI of 0.44 and an L ABI of 0.48 putting this  in the critical limb ischemia range.  She was unable to tolerate further  examination for duplex scanning in the office.   She was seen in the office on October 13, 2003 and reported a history of two  to three months of fairly sudden onset of severe bilateral exertional calf  and thigh discomfort compatible with claudication.  Her pain was not  radicular and was brought on by exertion.  It does not involve the buttocks,  but does involve the thighs and legs.  She also complains of coolness of  her  feet and toes.  She has not had any rest pain and she has not had to elevate  her legs or hang them down for relief.  Prior to two to three months ago  when this began she did have a history of claudication.  This was concerning  because of its relatively acute onset without a prior history of  claudication for possibility of acute thrombotic or occlusive vascular  event.   Dr. Alanda Amass had seen her for evaluation and had planned to get a CT angio  or MR angio of her legs.  However, she received only a CT angio at [facility] on October 07, 2003.  This demonstrated calcification near the renal  arteries that was fairly dense near the origin of the right renal artery and  moderate to severe stenosis at the origin of the left renal  artery.  There  was no stenosis noted in celiac, SMA, or IMA.  The distal abdominal aorta  measured 2.4 cm with calcific plaque at the aortic bifurcation and moderate  to severe proximal common iliac stenosis bilaterally.  Difficulty with  visualization because of calcification.  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   On examination at that time she was found to have faint posterior tibial  pulses bilaterally, absent DPs, and very difficult to feel femorals.  He was  able to get biphasic Doppler signals on the femorals bilaterally.   It was felt that clinically the patient had severe bilateral claudication  and diminished ABIs and CT angio of the abdomen suggests severe bilateral  iliac disease which fits with her clinical history.   At this point it was felt that she needed definitive lower extremity  angiography for definitive diagnostic evaluation and that she was a  potential candidate for percutaneous intervention depending on her anatomy  in view of her significant lower extremity symptoms which was  severely  lifestyle limiting.  At that point Dr. Alanda Amass explained the risks of  angiography and possible intervention to the patient, her daughter, and son  in detail and they were agreeable to accept these.  It was felt that should  Dr. Alanda Amass find a lesion that might benefit from intervention at  diagnostic study and if she were clinically stable he could proceed with  this.  The risk of bleeding, vessel occlusion, dissection, perforation,  heart attack, stroke, death were explained to the patient and family and  they agreed to proceed.  She was currently on aspirin and Plavix and would  plan to continue this along with her medications as outlined.  At that point  the patient will be scheduled for a PV angiogram as outlined.   HOSPITAL COURSE:  On October 20, 2003 [name] underwent PV angiogram by  Dr. Alanda Amass.  Please see his dictated report for detail.  Findings  revealed Leriche syndrome with subtotal occlusion distal abdominal aorta and  only trickle flow to the iliacs bilaterally with high grade right common and  external iliac and right CFA stenosis, moderate LEIA narrowing a patent  LCFA.  There is diffuse right SFA disease with three vessel runoff.  There  is intact profundus bilaterally with no significant proximal left SFA  stenosis.  Please see Dr. Kandis Cocking dictated report for all findings.  She  tolerated procedure well without complications.  Do note that during the  procedure she was found to have a right renal artery stenosis up to 95% and  he performed percutaneous intervention for this with a 50% residual.  In  regards to the lower extremity disease, he did perform PTA to the distal  abdominal aorta and planned to get vascular surgery evaluation.   Post procedure on October 21, 2003 the right groin catheterization site  looked okay.  The left brachial site had a large hematoma and ecchymosis. She was given heating pad to the left arm, continued pain  medications for  the left arm hematoma.   On October 21, 2003 she was seen in CVTS consultation by Dr. Arbie Cookey.  He  reviewed that she is having severe limiting bilateral leg claudication.  He  discussed options of aortobifemoral bypass and axillary fem-fem bypass and  would favor the axillary fem-fem if observation was not an option.  He  discussed this at length with the patient's daughter and the daughter-in-law  and planned to follow.   Was seen again by CVTS on October 22, 2003.  They noted that the left arm was  continued to have 2+ radial pulse.  There was no numbness or weakness in the  left hand.  The hematoma had no change.  At that point they further  discussed options with the patient and family.  It was ultimately decided to  plan to proceed with axillary fem-fem bypass and if revascularization were  continued to be desired.   When he followed up with them on October 23, 2003 she was with less left arm  pain and her left arm continued to be stable with 2+ radial pulses.  The  hematoma seemed to be improving.  He discussed with surgery again with  options of observation versus axillary fem-fem grafting.  They were opting  for surgery at that point.  Dr. Arbie Cookey felt that the patient was stable for  discharge home when okay from cardiology standpoint and he would plan to see  her back in the office for follow-up evaluation and plan for readmission for  surgery.   Also, on August 25 she was seen by Dr. Alanda Amass.  He reevaluated the left  upper extremity hematoma and it was tender with ecchymosis but the pulses  were okay.  He planned to keep her in the hospital today, continue warm pads  to the area, ambulate, and plan for probable discharge in the morning if  stable.   On October 24, 2003 she is seen again by Dr. Alanda Amass.  She is afebrile at  98.2, pulse 71, blood pressure 110/36, oxygen saturation 94% on 2 L.  She  has maintained sinus rhythm and no arrhythmia.  The hematoma  on the left arm  is much better.  She continues to have a good left radial and ulnar pulse.  At this point she is seen by Dr. Susa Griffins who deems her stable for  discharge home.   I did speak with Dr. Arbie Cookey prior to discharging the patient home.  He does  want to see her back in the office for evaluation at which time he will set  up a readmission for her surgery.   HOSPITAL PROCEDURES:  She ultrasound peripheral vascular angiogram by Dr.  Susa Griffins on October 20, 2003.  Please see his dictated report for  details as it is very lengthy.   HOSPITAL CONSULTS:  CVTS consultation by Dr. Arbie Cookey on October 21, 2003 for  evaluation of vascular surgery secondary to severe peripheral vascular  disease.  Again, see his notes and above dictation for findings in detail.   Telemetry shows sinus rhythm.  No significant arrhythmia.  Chest x-ray June  2005 shows COPD.  Suspect remote granulomatous disease with calcified granulomas in the left infrahilar region.  No acute air space disease.  L1  compression fracture age indeterminate.   LABORATORIES:  On October 22, 2003 white count 8.7, hemoglobin 15.6,  hematocrit 43.1, platelets 274.  PT 13.5, INR 1.0, PTT 28.  Sodium 141,  potassium 5.0, glucose 90, BUN 17, creatinine 1.2.   DISCHARGE MEDICATIONS:  1.  Norvasc 10 mg once daily.  2.  Aspirin 81 mg once daily.  3.  Celebrex 200 mg daily.  4.  Ocuvite one daily.  5.  Os-Cal D 500 mg t.i.d.  6.  Toprol XL 50 mg one-half tablet daily.  7.  Diovan 160  mg one daily.  8.  Lexapro 10 mg daily.  9.  Albuterol inhaler as needed.  10. Evista 60 mg daily.  11. Lipitor 20 mg daily.  12. Protonix 40 mg once daily.  13. Monistat cream t.i.d. to the perineum for five days until cleared.   No strenuous activity, lifting over 5 pounds, driving for four days.  Limited motion of the left arm.  No lifting with your left arm for at least  four days.   Call [phone] if you have any bleeding,  increased pain or size of the  swelling or burning on your leg or arm at the angiogram site.   I was going to hold the Plavix but the patient's daughter says that she will  be out of town and that the surgery definitely cannot be within the next one  to two weeks.  Therefore, I have told her to go ahead and let her take the  Plavix until she sees Dr. Arbie Cookey.  However, if her trip plans are changed and  it is possible that the patient may have surgery within the next one to two  weeks then the Plavix will need to be stopped.  I explained that the patient  will have to be off of Plavix for one week prior to surgery.   She has an appointment to see Dr. Arbie Cookey Thursday, September 1st at 2:45 p.m.  Dr. Kandis Cocking service will see her while she is in the hospital for her  surgery.  She can wait to see Dr. Alanda Amass for an office visit after her  surgery.   The patient was initially given Lipitor at the time of discharge which was  three days ago.  However, in review of her elevated CKs, we will contact the  patient and have her stop her Lipitor and will wait to follow up her CKs  before initiating statin therapy.      Mary B. Easley, P.A.-C.                   Richard A. Alanda Amass, M.D.    MBE/MEDQ  D:  10/27/2003  T:  10/27/2003  Job:  [id number]   cc:   Larina Earthly, M.D.  [address]  [place]  [place] [address]   Lenon Curt. Chilton Si, M.D.  [address]  [place]  [place] [address]  Fax: [phone]

## 2010-07-16 NOTE — Discharge Summary (Signed)
NAMEJANAYIA, [name] NO.:  [id number]   MEDICAL RECORD NO.:  [id number]          PATIENT TYPE:  INP   LOCATION:  2003                         FACILITY:  [facility]   PHYSICIAN:  Larina Earthly, M.D.    DATE OF BIRTH:  1915-11-19   DATE OF ADMISSION:  11/25/2003  DATE OF DISCHARGE:  11/29/2003                                 DISCHARGE SUMMARY   PRIMARY CARE PHYSICIAN:  Dr. Maxwell Caul   ADMISSION DIAGNOSES:  Severe bilateral lower extremity peripheral vascular  disease.   ADDITIONAL/DISCHARGE DIAGNOSES:  1.  Severe bilateral lower extremity arterial insufficiency status post      right axillobifemoral bypass completed on November 25, 2003 by Dr.      Arbie Cookey of CVTS.  2.  Arthritis.  3.  Chronic obstructive pulmonary disease.  4.  Hypertension.  5.  Hyperlipidemia.  6.  Leriche syndrome.  7.  Hiatal hernia.  8.  Diverticulitis.  9.  Macular degeneration.  10. History of transient ischemic attack approximately four years ago.  11. Surgical history includes appendectomy, tonsillectomy, carpal tunnel      repair, cataract removal of the left eye, and pessary placement.  12. Patient has a history of coronary artery disease with angioplasty in the      past.   HOSPITAL MANAGEMENT/PROCEDURES:  1.  Right axillobifemoral bypass utilizing an 8 mm Hemashield graft      completed on November 25, 2003 by Dr. Arbie Cookey.  2.  Postoperative lower extremity ABIs completed on November 27, 2003.      These revealed an ABI on the right of 0.71 (an increase from 0.44      preoperatively) as well as a left ABI of 0.58 (an increase from 0.48      preoperatively).   CONSULTS:  Case management   HISTORY OF PRESENT ILLNESS:  [name] is a pleasant 77 year old female  who was referred to Dr. Arbie Cookey from Dr. Alanda Amass for evaluation of bilateral  lower extremity claudication symptoms.  The onset of these symptoms began  approximately in June of 2005.  The patient described  the symptoms as  bilateral calf and thigh pain with ambulation.  The patient stated these  symptoms presented with minimal exertion.  Patient denied any rest pain or  night pain.  ABIs were performed and revealed an ABI of 0.44 on the right  and 0.48 on the left.  The patient had an outpatient CT angiogram performed  by Dr. Alanda Amass which revealed high grade stenosis of the left renal artery  and severe calcific distal aortoiliac disease.  Patient also underwent a  dobutamine Cardiolite stress test which was negative.  Dr. Arbie Cookey was then  consulted for consideration for surgical revascularization.  Patient was  seen and examined by Dr. Arbie Cookey in consultation on November 13, 2003 at  which time he felt the patient would benefit from an axillo to femoral and  femoral to femoral bypass grafting.  The risks, benefits, and alternatives  to the procedure were discussed with the patient at that time.  Patient was  in understanding and agreed to proceed with surgery.  HOSPITAL COURSE:  Patient was then admitted elective to [facility]  on November 25, 2003.  Patient was taken to the operating room and  underwent right axillobifemoral bypass grafting using an 8 mm Hemashield  graft completed by Dr. Arbie Cookey of CVTS.  Overall, the patient tolerate this  procedure well.  Of note, the patient was an extremely difficult intubation  per anesthesia staff.  Bronchoscopy via a #3 LMA was possible, but very  difficult.  There was a very small vocal cord opening.  The ETT tube size  was approximately 5-5.5.  For future surgery, the patient is to be  considered potentially nonintubatable.  Otherwise, the patient was extubated  on the operating room table.  She awoke from anesthesia neurologically  intact.  Patient was transferred to postanesthesia care unit in stable  condition.  Once awake, alert, and appropriate, the patient was then  transferred to 3300 for further management.   Postoperatively,  the patient's course was complicated by a fall in the early  a.m. of postoperative day #2.  The patient was somewhat confused and was  unaware of her surroundings for a short period of time.  Her vital signs  were stable and her incisions remained intact.  There were no signs or  abnormalities or changes in the patient's neurological status.  Otherwise,  the patient continued to do well after surgery.   She was deemed appropriate for transfer to the stepdown unit postoperative  day #2 or November 27, 2003.  Patient did complain of pain and redness in  her vaginal area as well as pain and soreness in her mouth.  On examination  the patient was found to have evidence of oral thrush and was started on  Nystatin swish and swallow appropriately.  She was also found to have a  groin yeast infection.  She was treated with Mycostatin cream appropriately.  Otherwise, patient had a strong palpable graft pulse.  Her incisions were  healing well without evidence of infection.  She was tolerating a regular  diet.  She had resumed normal bowel and bladder function.  Her pain was well  controlled with oral medications.  The patient had remained afebrile and  hemodynamically stable.  She was ambulating with assistance with the nursing  staff.  Postoperative ABIs revealed an increase in the right ABI from 0.44  to 0.71 as well as an increase in the left ABI from 0.48 to 0.58.  Patient  was deemed appropriate for initiation of discharge planning then with the  anticipated date of November 29, 2003.   DISPOSITION:  [name] is being discharged to [facility] independent  living in improved and stable condition.   DISCHARGE MEDICATIONS:  1.  Aspirin 81 mg daily.  2.  Toprol XL 25 mg daily.  3.  Nystatin cream applied to her groin area daily for 10 days.  4.  Nystatin swish and swallow b.i.d. for seven days.  5.  Evista 60 mg daily.  6.  Lexapro 30 mg daily.  7.  Diovan 160 mg daily. 8.  Plavix 75 mg  daily.  9.  Norvasc 10 mg daily.  10. Celebrex 200 mg daily.  11. Lunesta 5 mg q.h.s.  12. Protonix 40 mg daily.  13. Tylox one to two tablets q.4-6h. as needed for pain.   DISCHARGE INSTRUCTIONS:  1.  Activity:  The patient is to avoid driving.  She is to avoid heavy      lifting or strenuous activity.  Patient should continue to walk daily as      able.  2.  Diet:  The patient should follow a low fat, low salt, heart healthy      diet.  3.  Wound care:  The patient may shower.  She should wash her incisions      daily with soap and water.  She should notify the CVTS office if her      incisions become red, hot, swollen, or have any drainage.   FOLLOWUP:  1.  The patient is scheduled to see Dr. Arbie Cookey on Thursday, December 25, 2003      at 11 a.m.  Patient will have bilateral lower extremity ABIs completed      on that day.  2.  The patient is otherwise instructed to notify the CVTS office if she has      any questions or concerns in the meantime.      Catalina Gravel   CAF/MEDQ  D:  11/28/2003  T:  11/29/2003  Job:  [id number]   cc:   Sedonia Small, M.D.   Richard A. Alanda Amass, M.D.  [id number], [address]  [place]  [place] [address]  Fax: [phone]

## 2010-07-16 NOTE — Discharge Summary (Signed)
NAMEPEARLEY, MILLINGTON NO.:  [id number]   MEDICAL RECORD NO.:  [id number]          PATIENT TYPE:  INP   LOCATION:  5704                         FACILITY:  [facility]   PHYSICIAN:  Georgiana Spinner, M.D.    DATE OF BIRTH:  04/13/1915   DATE OF ADMISSION:  02/03/2004  DATE OF DISCHARGE:  02/06/2004                                 DISCHARGE SUMMARY   Ms. Tukes is a very lovely 77 year old lady who was admitted after  pneumoperitoneum was noted after her colonoscopy done because of rectal  bleeding and iron deficiency anemia.  Clinically, at the time of admission  the patient's abdomen was somewhat distended from air but was nontender.  During her hospital course, she was started on IV antibiotics, kept n.p.o.,  and she did exceedingly well.  Was extremely stable throughout the hospital  course.  Never developed any abdominal pain.  Her distention decreased  somewhat during the hospitalization but did not clear up entirely.  She was  passing flatus, had positive bowel sounds, remained afebrile, and on the  third hospital day a CT scan was done which showed sigmoid diverticulosis  without any extravasation of contrast material and otherwise unremarkable CT  of abdomen and pelvis.  This was followed on the next hospital day with  Gastrografin enema.  Again, no extravasation of contrast was noted, and no  abnormalities were noted other than diverticulosis of the sigmoid colon on  the barium enema.  Since the patient remained clinically quite stable, vital  signs were stable, and she was afebrile, had good bowel sounds, and abdomen  remained nontender, it was elected to then discharge her on a low residue  diet and Augmentin as an antibiotic 875 mg b.i.d. for the next 10 days, at  which point she could resume regular diet and stop the Augmentin.  She was  instructed to resume home medications at discharge and resume her regular  diet in 10 days, at which point she would  discontinued the Augmentin.   Laboratory studies at this time reveal a BMET was entirely normal, and CBC  at the time of discharge showed a white count of 10,400, hematocrit of 33.2,  platelet count of 302,000, and a CMET done on admission showed total protein  5.8, slightly low; albumin 3.4, also slightly low, but otherwise  unremarkable.  The patient was discharged home in condition for followup as  an outpatient, as described above.       GMO/MEDQ  D:  02/06/2004  T:  02/07/2004  Job:  [id number]   cc:   Lenon Curt. Chilton Si, M.D.  [address]  [place]  [place] [address]  Fax: [phone]   Velora Heckler, MD  [phone] [address] [place]  [place] [address]  Fax: [phone]

## 2010-07-16 NOTE — Op Note (Signed)
NAMEMASAE, [name]                    ACCOUNT NO.:  [id number]   MEDICAL RECORD NO.:  [id number]                   PATIENT TYPE:  OIB   LOCATION:  2899                                 FACILITY:  [facility]   PHYSICIAN:  Guadelupe Sabin, M.D.             DATE OF BIRTH:  1916-01-24   DATE OF PROCEDURE:  08/19/2003  DATE OF DISCHARGE:                                 OPERATIVE REPORT   PREOPERATIVE DIAGNOSIS:  Senile nuclear cataract, age-related macular  degeneration, left eye.   POSTOPERATIVE DIAGNOSIS:  Senile nuclear cataract, age-related macular  degeneration, left eye.   OPERATION PERFORMED:  Planned extracapsular cataract extraction,  phacoemulsification, primary insertion of posterior chamber intraocular lens  implant.   SURGEON:  Guadelupe Sabin, M.D.   ASSISTANT:  Nurse.   ANESTHESIA:  Local 4% Xylocaine, 0.75% Marcaine retrobulbar block, topical  tetracaine, intraocular Xylocaine.  Anesthesia standby required.  The  patient was given sodium pentothal intravenously during the period of  retrobulbar blocking.   DESCRIPTION OF PROCEDURE:  After the patient was prepped and draped, the lid  speculum was inserted in the left eye.  The eye was turned downward and the  superior rectus traction suture placed.  Schiotz tonometry was recorded at 5  scale units with a 5.5 g weight.  A peritomy was performed adjacent to the  limbus from the 11 to the 1 o'clock position.  The corneoscleral junction  was cleaned and a corneoscleral groove made with a 45 degree superblade.  The anterior chamber was then entered with a 2.5 mm diamond keratome at the  12 o'clock position and the 15 degree blade at the 2:30 position.  Using a  bent 26 gauge needle, on an OcuCoat syringe, a circular capsulorrhexis was  begun and then completed with a Grabow forceps.  Hydrodissection and  hydrodelineation were performed using 1% Xylocaine.  The 30 degree  phacoemulsification tip was then inserted  with slow controlled  emulsification of the lens nucleus with back cracking and fragmentation with  a Bechert pick.  A total ultrasonic time one minute 41 seconds, average  power level 8%, total amount of fluid used 100 mL.  Following removal of the  nucleus, the residual cortex was aspirated with the irrigation aspiration  tip.  The posterior capsule appeared intact with a brilliant red fundus  reflex.  It was therefore elected to insert an [employer]  [id number] silicone three-piece posterior chamber intraocular lens implant.  Diopter strength +22.00.  This was inserted with the McDonald forceps into  the anterior chamber and then centered into the capsular bag using the  [facility] lens rotator.  The lens appeared to be well centered.  The Healon and  OcuCoat which had been used during the procedure intermittently was  aspirated and replaced with balanced salt solution and Miochol ophthalmic  solution.  The operative incisions were tested and felt to be self sealing.  It was however, elected to  place a single 10-0 interrupted nylon suture  across the 12 o'clock position to ensure closure and to prevent  endophthalmitis. Maxitrol ointment was instilled in the conjunctival cul-de-  sac and a light patch and protector shield applied.  During of procedure and  anesthesia administration 45 minutes.  The patient tolerated the procedure  well in general and left the operating room for the recovery room and  subsequently to the 23 hour observation unit.                                              Guadelupe Sabin, M.D.   HNJ/MEDQ  D:  08/19/2003  T:  08/19/2003  Job:  [id number]

## 2010-07-16 NOTE — H&P (Signed)
NAME:  [name], [name]                    ACCOUNT NO.:  [id number]   MEDICAL RECORD NO.:  [id number]                   PATIENT TYPE:  OIB   LOCATION:  2899                                 FACILITY:  [facility]   PHYSICIAN:  Guadelupe Sabin, M.D.             DATE OF BIRTH:  30-Dec-1915   DATE OF ADMISSION:  08/19/2003  DATE OF DISCHARGE:                                HISTORY & PHYSICAL   This was a planned outpatient surgical admission of this 77 year old white  female, admitted for cataract implant surgery of the left eye.   HISTORY OF PRESENT ILLNESS:  This patient has been followed in my office  since August 06, 1984, when she was referred for distortion of vision and found  to have early age-related macular degeneration of both eyes.  This has  persisted reducing her vision from the initial 20/100 right and 20/25 left  eye to 20/400 in each eye.  Over the recent years, gradual cataract  formation has developed in both eyes and it was felt that this was  contributing to her poor vision.  The patient requested cataract implant  surgery in an attempt to improve her vision understanding that it would not  significantly bring her back to a normal vision range.  She signed an  informed consent and arrangements were made for her outpatient admission at  this time.   PRIMARY CARE PHYSICIAN:  The patient is under the care of Dr. Lenon Curt.  Green.   The patient resides at [facility].   PAST MEDICAL HISTORY:  Dr. Chilton Si notes the patient's:  1. Underlying coronary artery disease,  2. Macular degeneration.  3. Chronic obstructive pulmonary disease.  4. Diverticulosis.  5. Herniated cervical disk.  6. Arthritis.  7. Hyperlipemia.  8. The patient has had a minor stroke in May 2000.  The patient is in a stable general condition at the present time and is felt  to be at low risk for the proposed surgery under local anesthesia.   ALLERGIES:  1. CODEINE.  2. SULFA.   REVIEW OF SYSTEMS:  No current cardiorespiratory complaints.   PHYSICAL EXAMINATION:  VITAL SIGNS:  As recorded on admission, blood  pressure 137/66, pulse 46, respirations 16, temperature 97.1.  GENERAL APPEARANCE:  The patient is a pleasant, well nourished, well  developed, blind, white female in no acute distress.  HEENT:  Eyes:  Visual acuity as noted above.  Applanation tonometry normal.  Slit lamp examination:  Nuclear cataract with slight brunescent tint to both  eyes.  Fundus examination:  Vitreous clear.  Retina attached.  Dry,  atrophic, age-related macular degeneration both eyes covers __________ zone.  CHEST:  Lungs clear to percussion auscultation.  HEART:  Normal sinus rhythm.  No cardiomegaly.  No murmurs.  ABDOMEN:  Negative.  EXTREMITIES:  Negative.   ADMISSION DIAGNOSIS:  1. Senile nuclear cataract, both eyes.  2. Dry age-related macular degeneration, both eyes.  SURGICAL PLAN:  Cataract implant surgery, left eye now; right eye later as  needed.  The patient will transferred to the 23 hour observation unit  overnight to begin her topical frequent medications for her left eye and  systemic condition.  Discharge is planned the following morning.                                                Guadelupe Sabin, M.D.    HNJ/MEDQ  D:  08/19/2003  T:  08/20/2003  Job:  [id number]

## 2010-07-16 NOTE — Discharge Summary (Signed)
NAMEMALASHIA, [name] NO.:  [id number]   MEDICAL RECORD NO.:  [id number]          PATIENT TYPE:  INP   LOCATION:  6732                         FACILITY:  [facility]   PHYSICIAN:  Lonia Blood, M.D.DATE OF BIRTH:  July 29, 1915   DATE OF ADMISSION:  02/27/2004  DATE OF DISCHARGE:  03/09/2004                                 DISCHARGE SUMMARY   DISCHARGE DIAGNOSES:  1.  C-difficile colitis.  2.  Hypokalemia and hypomagnesemia.      1.  Secondary to above.      2.  Corrected at discharge.  3.  Severe dehydration - resolved.  4.  Hypertension - outpatient follow-up required.  5.  Chronic osteoarthritis.  6.  Chronic obstructive pulmonary disease by history.  7.  Hyperlipidemia.  8.  Known hiatal hernia.  9.  Macular degeneration - the patient is legally blind.  10. Peripheral vascular disease - status post right femoral/popliteal bypass      in December of 2005.  11. Known coronary artery disease.  12. Status post appendectomy.  13. Status post tonsillectomy.  14. Status post carpal tunnel repair under coronary artery disease, status      post angioplasty, details unknown.  15. Allergies to Codeine, sulfa, Zocor, morphine, and Ultram.  16. Left renal artery stenosis noted on previous evaluations.   DISCHARGE MEDICATIONS:  1.  Flagyl 500 mg p.o. t.i.d. for 10 days and then stop.  2.  Lopressor 50 mg p.o. b.i.d.  3.  Questran 2 grams t.i.d. for 10 days and then stop.  4.  Lunesta 2 mg 1/2 tablet q.h.s. p.r.n. sleep.   FOLLOW UP:  The patient is advised to follow up at [facility] in 7  to 10 days for routine reevaluation.  At that time, basic metabolic panel  and magnesium level should be obtained to be sure that the patient's  electrolytes remain balanced.  CBC should also be checked and the patient  should be followed closely as aspirin and Plavix are being resumed at  discharge and we need to assure that she is tolerating these well.   CONSULTATIONS:  None.   PROCEDURE:  CT scan of the abdomen and pelvis on February 28, 2004 - no  acute findings in the abdomen.  Extensive atherosclerotic disease with  decreased perfusion to the left kidney.  Bilateral renal cysts.   HISTORY OF PRESENT ILLNESS:  The patient is an 77 year old patient who  resides at [facility].  She presented to the hospital on  February 27, 2005, with complaints of severe abdominal pain, nausea,  diarrhea, and fever.  Temperature of 102 was confirmed and the patient  provided further details to include 20 episodes of explosive diarrhea prior  to her presentation to the ER.  She was admitted to the acute unit for  ongoing care.   HOSPITAL COURSE:  The patient was admitted to the acute unit of [facility] for evaluation of explosive watery diarrhea.  She had been admitted  to the hospital earlier in December of 2005 because of a ruptured  diverticulum during a colonoscopy.  She recovered  nicely from that hospital  stay.  Given her recent hospitalization and the fact that she lives within a  nursing facility, there was great concern for C.difficile colitis.  C.difficile toxin was obtained and was, in fact, positive.  Treatment was  begun using Flagyl.  Clinically, the patient began to respond with decreased  fever.  She remained symptomatic, however, with extensive watery diarrhea.  Questran was added to the patient's regimen.  With the use of Questran and  with ongoing use of Flagyl, the patient's bowel movements began to firm up.  At the time of discharge, she is tolerating a full regular diet without  difficulty.  She is tolerating her Questran and Flagyl without difficulty.  She has been advised to avoid alcohol intake in any amount until such time  that she completes her Flagyl therapy.  At the date of discharge, she is  having approximately two semi-formed bowel movements a day.  Intake is  normal.  She is afebrile.  Vital  signs are stable.   The patient has a longstanding history of hypertension.  This has been  poorly controlled in the past.  During the hospital stay, she was maintained  on her Lopressor at 50 mg b.i.d.  Systolic blood pressures in the days prior  to her discharge have been ranging in the 140 to 160 range.  This was above  our target range for this patient.  It is recommended that blood pressure  continue to be followed in the outpatient setting and consideration be given  to adding an additional agent in the outpatient setting as appropriate.   The patient also has a known longstanding history of peripheral vascular  disease.  Because of recent pneumoperitoneum, the patient's aspirin and  Plavix have been held.  They were held during this hospitalization because  of extensive diarrhea and concern for GI disturbance.  They are being  resumed at the time of her discharge and she needs to be followed closely in  the outpatient setting to ensure there is no significant GI intolerance or  bleeding associated with this.  Follow-up CBC in 7 to 10 days is  recommended.   There is some concern that the patient has developed a psychological if not  physiologic dependence upon sedating medications to include Phenergan,  Ambien, benzodiazepines, and other sedatives.  This has been discussed at  length with the nurse practitioner caring for the patient at the home as  well as with the nursing team and the patient's family.  We are attempting  to avoid these medications.  The patient has done well with Lunesta in the  past.  She is maintained on this during the second portion of the  hospitalization and at the time of discharge, this is the only sedating  medication prescribed to her.   On March 09, 2004, the patient is cleared for discharge home.  Vital signs  are stable.  She is tolerating good p.o. intake.  She will complete 10 additional days of Questran and Flagyl for her C.difficile colitis  and  follow-up is recommended at that time, at which time a BMET, magnesium, and  CBC should be obtained.      Trey Paula   JTM/MEDQ  D:  03/09/2004  T:  03/09/2004  Job:  [id number]

## 2010-07-16 NOTE — H&P (Signed)
NAMEVICKI, CHAFFIN NO.:  [id number]   MEDICAL RECORD NO.:  [id number]          PATIENT TYPE:  INP   LOCATION:  1823                         FACILITY:  [facility]   PHYSICIAN:  Karlene Einstein, M.D.DATE OF BIRTH:  [birth year]   DATE OF ADMISSION:  02/27/2005  DATE OF DISCHARGE:                                HISTORY & PHYSICAL   CHIEF COMPLAINT:  Abdominal pain, nausea, diarrhea and fever.   HISTORY OF PRESENT ILLNESS:  [age]-year-old Caucasian female who  recently had diverticulitis and perforation, and presented to [facility] via EMS secondary to above complaints.  The patient is here  with her son, who is at the bedside and who gave part of the history as  well.  The patient symptoms started the night of the day before, however,  she was afebrile.  Her diarrhea and nausea continued throughout yesterday  and finally she spiked a fever of 102 at home.  She has had about 20  episodes of explosive diarrhea so far.  She does have abdominal pain  diffusely, but no radiation.  Per son, no melena or hematochezia noted.   PAST MEDICAL HISTORY:  1.  History of diverticulitis.  2.  Arthritis.  3.  COPD.  4.  Hypertension.  5.  Hyperlipidemia.  6.  Leriche syndrome.  7.  Hiatal hernia.  8.  Macular degeneration -- the patient is legally blind.  9.  Peripheral vascular disease.  10. Coronary artery disease.   PAST SURGICAL HISTORY:  1.  Appendectomy.  2.  Tonsillectomy.  3.  Status post right fem-pop bypass in December of 2005.  4.  Carpal tunnel repair.  5.  History of angioplasty.  6.  Left cataract removal.  7.  Pessary placement.   FAMILY HISTORY:  Diabetes mellitus and coronary artery disease.   SOCIAL HISTORY:  The patient is married.  She lives alone at [facility] and her husband lives in [facility].  She  denies tobacco use.  She drinks wine occasionally.  She has a son and a  daughter.   ALLERGIES:  CODEINE, SULFA, ZOCOR, MORPHINE and ULTRAM.   REVIEW OF SYSTEMS:  GENERAL:  Fever, chills, fatigue and some weakness.  SKIN: Denies rash, itching or wounds.  EYES:  She is legally blind but  denies eye pain, itching or discharge.  EARS:  Denies change in hearing,  ringing or earache.  MOUTH AND THROAT:  Denies oral discomfort, gingival  pain or bleeding.  RESPIRATORY:  Denies dyspnea at rest or with exertion,  cough, wheezing, hemoptysis.  CARDIAC:  Denies chest pain or palpitations.  BREASTS:  Denies breast mass or nipple discharge.  GI:  As in history of  present illness.  GU:  Denies dysuria or frequency or hematuria.  MUSCULOSKELETAL:  Denies joint pains, muscle pain or restricted movement.  CIRCULATION:  Denies claudication, edema or cold extremities.  NEUROLOGIC:  Denies dizziness, syncope, numbness or headache.  PSYCHIATRIC:  Denies  feelings of depression or anxiety.  No hallucinations.  ENDOCRINE:  Denies  polyuria, polydipsia, heat or cold intolerance.  HEMATOLOGIC/LYMPH:  Denies  excessive bruising, petechiae, enlarged lymph nodes.   PHYSICAL EXAM:  VITAL SIGNS:  Temperature 100, BP 142/59, pulse 78,  respiratory rate 20, pulse oximetry 98% on 3 L of oxygen.  GENERAL:  Well-nourished, in no acute distress, normal body habitus.  SKIN:  Skin warm and dry.  No suspicious lesions or rash.  HEENT:  Head normal in size and contour, no evidence of trauma.  Eyes:  Lids  open and close normally.  No blepharitis.  Pupils not reactive to light.  She has a history of cataract surgery.  Conjunctivae clear and sclerae  white.  Mouth and throat:  Lips are without lesions.  Oral mucosa mildly  dry.  Uvula elevated midline.  Tongue is normal in shape and size without  lesions.  NECK:  Neck supple.  No carotid bruits, thyromegaly or masses.  LYMPHATICS:  No cervical lymphadenopathy.  RESPIRATORY:  Breathing is even and unlabored.  Breath sounds clear to  auscultation bilaterally.   CHEST:  Chest is nontender and normal in contour.  HEART:  Regular rate and rhythm.  No murmurs, gallops or rubs.  ABDOMEN:  Bowel sounds hyperactive.  No bruits.  Soft.  Minimally tender in  lower abdomen.  No hepatosplenomegaly, masses or hernias.  MUSCULOSKELETAL:  No deformities.  Movement at each extremity full and  painless.  Strength 5/5 at each extremity.  CIRCULATION:  Pedal pulses 2+.  No edema.  NEUROLOGICAL:  Cranial nerves grossly intact.  DTRs +1, no tremor.  Speech  is clear.  PSYCHIATRIC:  Alert and oriented x3.  Affect and behavior appropriate.   LABORATORIES:  Potassium 3.4, sodium 132, BUN 10, glucose 151, creatinine  0.9.  White count 18.6, hemoglobin 11.4, MCV 86.8, platelets 299,000, ANC  16.5.  UA:  Nitrite negative, leukocyte esterase moderate, wbc's 7-10, rbc's  0-2.   Abdominal and pelvic CT:  Interval resolution of free air, no abscess, no  new acute findings.  I have independently reviewed the image of the  abdominal and pelvic CT scan.   ASSESSMENT AND PLAN:  1.  Fever, abdominal pain and diarrhea.  Her CT scan is negative for any      acute findings, however, she was recently on multiple antibiotics,      therefore C. difficile colitis is the highly suspicious cause.  I will      admit her for further observation and hydration, send stool for C.      difficile toxin and wbc's, give intravenous fluids, continue oral      Flagyl, given Phenergan as needed, keep patient nothing-by-mouth except      Flagyl, hold all other oral medications.  2.  Hypokalemia likely secondary to diarrhea.  Replete.  Check magnesium      level.  3.  Hyperglycemia.  The patient does not have a history of diabetes      mellitus.  Check fasting glucose level.  4.  Mild pyuria.  We will check urine for culture and sensitivity, since she      has history of recurrent urinary tract infections.  We will not give any     other antibiotics yet.  5.  Normocytic anemia.  Hemoglobin is  stable; last hemoglobin was 11.2 on      February 06, 2004.  6.  Hypertension, borderline.  We will change Lopressor to intravenous.   I have reviewed the patient's past medical history in the [facility].  I have also obtained history from patient's son.  He stated that  patient's all other medications, doses and frequency will be notified to [place];  he does not know them at this point.       GD/MEDQ  D:  02/28/2004  T:  02/28/2004  Job:  [id number]

## 2011-02-24 DIAGNOSIS — R339 Retention of urine, unspecified: Secondary | ICD-10-CM

## 2011-02-24 HISTORY — DX: Retention of urine, unspecified: R33.9

## 2011-03-15 DIAGNOSIS — S62109A Fracture of unspecified carpal bone, unspecified wrist, initial encounter for closed fracture: Secondary | ICD-10-CM

## 2011-03-15 HISTORY — DX: Fracture of unspecified carpal bone, unspecified wrist, initial encounter for closed fracture: S62.109A

## 2011-09-06 DIAGNOSIS — Z79899 Other long term (current) drug therapy: Secondary | ICD-10-CM

## 2011-09-06 HISTORY — DX: Other long term (current) drug therapy: Z79.899

## 2012-06-14 ENCOUNTER — Other Ambulatory Visit: Payer: Self-pay | Admitting: Geriatric Medicine

## 2012-06-14 MED ORDER — DIAZEPAM 5 MG PO TABS: ORAL_TABLET | ORAL | Status: DC

## 2012-07-11 ENCOUNTER — Other Ambulatory Visit: Payer: Self-pay | Admitting: Geriatric Medicine

## 2012-07-11 MED ORDER — DIAZEPAM 5 MG PO TABS: ORAL_TABLET | ORAL | Status: DC

## 2012-08-02 ENCOUNTER — Encounter: Payer: Self-pay | Admitting: *Deleted

## 2012-08-03 ENCOUNTER — Encounter: Payer: Self-pay | Admitting: *Deleted

## 2012-08-14 ENCOUNTER — Other Ambulatory Visit: Payer: Self-pay | Admitting: *Deleted

## 2012-08-14 MED ORDER — DIAZEPAM 5 MG PO TABS: ORAL_TABLET | ORAL | Status: DC

## 2012-09-11 ENCOUNTER — Non-Acute Institutional Stay (SKILLED_NURSING_FACILITY): Payer: Medicare Other | Admitting: Geriatric Medicine

## 2012-09-11 ENCOUNTER — Encounter: Payer: Self-pay | Admitting: Geriatric Medicine

## 2012-09-11 DIAGNOSIS — F332 Major depressive disorder, recurrent severe without psychotic features: Secondary | ICD-10-CM

## 2012-09-11 DIAGNOSIS — E119 Type 2 diabetes mellitus without complications: Secondary | ICD-10-CM

## 2012-09-11 DIAGNOSIS — F0280 Dementia in other diseases classified elsewhere without behavioral disturbance: Secondary | ICD-10-CM

## 2012-09-11 NOTE — Assessment & Plan Note (Addendum)
Most recent MDS 5/2014Pt. Not able to participate in BIMS, staff assessment: short/long term memory impairment, pt. Is able to recall where she lives (nursing home), her room, staff names, severely impaired decision making capacity. Functional status: limited assistance w. Most ADLs, dependent w/ bathing. Ambulates w/ walker. Occasionally incontinent of urine, continent with bowels.

## 2012-09-11 NOTE — Progress Notes (Deleted)
Patient ID: [name], female   DOB: 14-Nov-1915, 79 y.o.   MRN: [id number] [facility] [phone])  Chief Complaint  Patient presents with  . Medical Managment of Chronic Issues    AD, HTN, DM    HPI   {Rehab current functional status:30803}    Allergies  Allergen Reactions  . Bupropion   . Ciprocinonide (Fluocinolone)   . Codeine   . Nortriptyline   . Septra (Sulfamethoxazole W-Trimethoprim)   . Sulfa Antibiotics   . Ultram (Tramadol)    Medications ***  DATA REVIEWED  Radiologic Exams:   Cardiovascular Exams:   Laboratory Studies: [facility] 09/08/2011 WBC 10.5, Hgb 13.2, Hct 39.3, Plt 381 Glu 88, BUN 14, Cr .84, Na 140, K+ 4.4, alb 3.4. LFTsWNL TSH 3.450 HgbA1c 6.4 01/31/2012: WBC 11.7, hemoglobin 14.8, hematocrit 42.5, platelets 433  A1c 6.6  04/19/2012 WBC 9.9, hemoglobin 13.3, hematocrit 38.5, platelets  Glucose 103, BUN 20, creatinine 0.84, sodium 140, potassium 4.7     PMH ***  PSH ***  FH ***  SH ***  Review of Systems ***  Physical Exam Filed Vitals:   09/11/12 1208  BP: 154/76  Pulse: 80  Temp: 98.2 F (36.8 C)  Resp: 22  Weight: 124 lb (56.246 kg)   There is no height on file to calculate BMI. *** ASSESSMENT/PLAN ***  Follow up:   Lab 09/13/12 CBC, CMP, TSH, A1C  Christyl Osentoski T.Theressa Piedra, NP-C 09/11/2012

## 2012-09-20 ENCOUNTER — Encounter: Payer: Self-pay | Admitting: Geriatric Medicine

## 2012-09-20 NOTE — Assessment & Plan Note (Signed)
Continues to be followed at intervals by Dr. Donell Beers, no recent medication changes. Continues to do best with one-on-one interactions, able to be out of facility on occasion with family, continues with personal trainer for ambulation 3 times a week

## 2012-09-20 NOTE — Progress Notes (Signed)
Patient ID: [name], female   DOB: 1915/04/23, 79 y.o.   MRN: [id number] [facility] (13)  Code Status: Living Will, Full Code  Contact Information   Name Relation Home Work [place] Daughter [phone]  [phone]      Chief Complaint  Patient presents with  . Medical Managment of Chronic Issues    AD, HTN, DM    HPI: This is a 79 y.o. female resident of [facility],  Memory Care section evaluated today for management of ongoing medical issues.  Review of record shows patient's vital signs, weight and p.o. intake have been stable. No significant changes in her functional or cognitive status, interactions are limited due to combination of dementia, very low vision and poor hearing. Patient continues to have repetitive verbal behaviors, best managed with one-to-one interventions.   Bathing: Dependent Bed Mobility: Minimal Assist Bladder Management: Occ Incont, Pad/diapers, Bowel Management: Continent Feeding: Minimal Assist Hygiene and Grooming: Minimal Assist, Toileting / Clothing: Moderate Assist Transfers: Minimal Assist, Walk: Minimal Assist    Allergies  Allergen Reactions  . Bupropion   . Ciprocinonide (Fluocinolone)   . Codeine   . Nortriptyline   . Septra (Sulfamethoxazole W-Trimethoprim)   . Sulfa Antibiotics   . Ultram (Tramadol)    Medications    Medication List       This list is accurate as of: 09/11/12 11:59 PM.  Always use your most recent med list.               acetaminophen 325 MG tablet  Commonly known as:  TYLENOL  Take 650 mg by mouth every 8 (eight) hours as needed for pain. Take two tablets every 8 hours daily as needed for pain.     ALPRAZolam 0.25 MG tablet  Commonly known as:  XANAX  Take 0.25 mg by mouth at bedtime as needed for sleep. Take 1 tablet by mouth every 8 hours as needed for anxiety.     amLODipine 10 MG tablet  Commonly known as:  NORVASC  Take 10 mg by  mouth daily. Take 1 tablet daily to treat high blood pressure.     CALTRATE 600+D PLUS PO  Take by mouth daily. Take one tablet twice daily for calcium.     CENTRUM SILVER ADULT 50+ PO  Take by mouth daily.     chlorpheniramine 4 MG tablet  Commonly known as:  CHLOR-TRIMETON  Take 4 mg by mouth 2 (two) times daily as needed for allergies. Take 1 tablet twice daily to help preserve memory/functional status and moderate mood.     clopidogrel 75 MG tablet  Commonly known as:  PLAVIX  Take 75 mg by mouth daily. Take one daily.     diazepam 5 MG tablet  Commonly known as:  VALIUM  Take 1/2 tablet by mouth once daily.     enalapril 10 MG tablet  Commonly known as:  VASOTEC  Take 10 mg by mouth daily. Take one daily at 2 pm to control BP.     metFORMIN 500 MG tablet  Commonly known as:  GLUCOPHAGE  Take 500 mg by mouth daily before supper. Take one daily to treat DM.     mirtazapine 15 MG tablet  Commonly known as:  REMERON  Take 15 mg by mouth 2 (two) times daily. Take one at 9 am and 4 pm to help with depression.     NAMENDA XR 14 MG Cp24  Generic drug:  Memantine  HCl ER  Take 1 capsule by mouth daily.     oxybutynin 5 MG tablet  Commonly known as:  DITROPAN  Take 5 mg by mouth at bedtime.     PARoxetine 40 MG tablet  Commonly known as:  PAXIL  Take 40 mg by mouth every morning. Give 1 daily to treat depression/anxiety.     rivastigmine 9.5 mg/24hr  Commonly known as:  EXELON  Place 1 patch onto the skin daily. Change patch daily to preserve memory and functioning.     senna 8.6 MG tablet  Commonly known as:  SENOKOT  Take 1 tablet by mouth daily. Take 1 every night for laxative - hold for loose stool.     sodium chloride 0.65 % nasal spray  Commonly known as:  OCEAN  Place 1 spray into the nose as needed for congestion. Give  2 sprays each nostril twice daily.       DATA REVIEWED  Radiologic Exams:   Cardiovascular Exams:   Laboratory Studies:   [facility]  lab 09/08/2011 WBC 10.5, Hgb 13.2, Hct 39.3, Plt 381 Glu 88, BUN 14, Cr .84, Na 140, K+ 4.4, alb 3.4. LFTsWNL TSH 3.450 HgbA1c 6.4 01/31/2012: WBC 11.7, hemoglobin 14.8, hematocrit 42.5, platelets 433  A1c 6.6   Review of Systems   DATA OBTAINED: from patient, nurse, medical record, ( patient is poor historian) GENERAL: Feels well   No fevers, fatigue, change in appetite or weight SKIN: No itch, rash or open wounds EYES: No eye pain, dryness or itching   Nearly Blind EARS: No earache, change in hearing     Very HOH NOSE: No congestion, drainage or bleeding MOUTH/THROAT: No mouth or tooth pain  No sore throat No difficulty chewing or swallowing RESPIRATORY: No cough, wheezing, SOB CARDIAC: No chest pain, palpitations  No edema. GI: No abdominal pain  No N/V/D or constipation  No heartburn or reflux  GU: No dysuria, frequency or urgency  No change in urine volume or character Occasional incontinence MUSCULOSKELETAL: No joint pain, swelling or stiffness  No back pain  No muscle ache, pain, weakness  Gait is unsteady  No recent falls.  NEUROLOGIC: No dizziness, fainting, headache, No change in mental status (dementia).  PSYCHIATRIC: Frequent anxiety. Sleeps well.  Ongoing repetitive verbal behavior.    Physical Exam Filed Vitals:   09/11/12 1208  BP: 154/76  Pulse: 80  Temp: 98.2 F (36.8 C)  Resp: 22  Weight: 124 lb (56.246 kg)   There is no height on file to calculate BMI.  GENERAL APPEARANCE: No acute distress, appropriately groomed, frail body habitus. Alert, pleasant, conversation is limited. SKIN: No diaphoresis, rash, unusual lesions, wounds HEAD: Normocephalic, atraumatic EYES: Conjunctiva/lids clear.   EARS: Very poor hearing . NOSE: No deformity or discharge. MOUTH/THROAT: Lips w/o lesions. Oral mucosa, tongue moist, w/o lesion. Oropharynx w/o redness or lesions.  NECK: Supple, full ROM. No thyroid tenderness, enlargement or nodule LYMPHATICS: No head, neck or  supraclavicular adenopathy RESPIRATORY: Breathing is even, unlabored. Lung sounds are clear and full.  CARDIOVASCULAR: Heart RRR. No murmur or extra heart sounds  ARTERIAL: No carotid bruit.   VENOUS: No varicosities. No venous stasis skin changes  EDEMA: No peripheral edema.  GASTROINTESTINAL: Abdomen is soft, non-tender, not distended w/ normal bowel sounds. MUSCULOSKELETAL: Moves all extremities with full ROM, strength and tone.  Gait is unsteady, short steps, uses walker NEUROLOGIC: Not oriented to time, place,Speech clear, no tremor.   PSYCHIATRIC: Mood and affect appropriate to situation  ASSESSMENT/PLAN  Alzheimer's disease Most recent MDS 5/2014Pt. Not able to participate in BIMS, staff assessment: short/long term memory impairment, pt. Is able to recall where she lives (nursing home), her room, staff names, severely impaired decision making capacity. Functional status: limited assistance w. Most ADLs, dependent w/ bathing. Ambulates w/ walker. Occasionally incontinent of urine, continent with bowels.  Major depressive disorder, recurrent episode, severe, without mention of psychotic behavior Continues to be followed at intervals by Dr. Donell Beers, no recent medication changes. Continues to do best with one-on-one interactions, able to be out of facility on occasion with family, continues with personal trainer for ambulation 3 times a week  Type II or unspecified type diabetes mellitus without mention of complication, not stated as uncontrolled Patient continues on 1 daily dose of metformin, weekly CBGs satisfactory, less than 120. No s/s of hypoglycemia Check A1c    Follow up: Routine or as needed Lab 09/13/2012 CBC, CMP, TSH, A1c  Treyden Hakim T.Tyquisha Sharps, NP-C 09/11/2012

## 2012-09-20 NOTE — Assessment & Plan Note (Signed)
Patient continues on 1 daily dose of metformin, weekly CBGs satisfactory, less than 120. No s/s of hypoglycemia Check A1c

## 2012-11-19 ENCOUNTER — Other Ambulatory Visit: Payer: Self-pay | Admitting: Geriatric Medicine

## 2012-11-19 MED ORDER — ASPIRIN EC 81 MG PO TBEC: 81.0000 mg | DELAYED_RELEASE_TABLET | Freq: Every day | ORAL | Status: AC

## 2012-11-19 NOTE — Progress Notes (Signed)
Patient's daughter requested medication review due to financial reasons. Have d/c Plavix, will start low dose ASA for stroke risk prophylaxis. Will taper and d/c Exelon patch. Patient is talking oxybutynin with good relief of bladder symptoms. Will stop metformin, pt;'s most recent A1C 6.4, well within range for 79 yo female.

## 2012-12-11 ENCOUNTER — Encounter: Payer: Self-pay | Admitting: Geriatric Medicine

## 2012-12-11 ENCOUNTER — Non-Acute Institutional Stay: Payer: Medicare Other | Admitting: Geriatric Medicine

## 2012-12-11 DIAGNOSIS — F0280 Dementia in other diseases classified elsewhere without behavioral disturbance: Secondary | ICD-10-CM

## 2012-12-11 DIAGNOSIS — F332 Major depressive disorder, recurrent severe without psychotic features: Secondary | ICD-10-CM

## 2012-12-11 DIAGNOSIS — E119 Type 2 diabetes mellitus without complications: Secondary | ICD-10-CM

## 2012-12-11 DIAGNOSIS — I10 Essential (primary) hypertension: Secondary | ICD-10-CM

## 2012-12-11 MED ORDER — RIVASTIGMINE 9.5 MG/24HR TD PT24: 1.0000 | MEDICATED_PATCH | Freq: Every day | TRANSDERMAL | Status: DC

## 2012-12-11 NOTE — Assessment & Plan Note (Signed)
Patient receives enalapril and amlodipine for blood pressure management. Recent blood pressure and record 83/60. Today 139/67. Last nursing staff to do daily blood pressure measurements to be sure she is not experiencing hypotension. Most recent laboratory studies satisfactory.

## 2012-12-11 NOTE — Assessment & Plan Note (Signed)
Mood and behavior is essentially unchanged. Continue current medications and one-on-one interventions

## 2012-12-11 NOTE — Assessment & Plan Note (Signed)
Metformin was stopped last month, most recent CBGs are satisfactory. Continue to monitor weekly and check A1c at intervals.

## 2012-12-11 NOTE — Assessment & Plan Note (Addendum)
Most recent MDS reviewed from 09/2012:Not able to participate in BIMS, Staff assessment: short/long term memory impairment, pt. Is no longer able to recall where she lives (nursing home), Is able to recall her room, staff names, has severely impaired decision making capacity. Functional status: limited assistance w. Most ADLs, Ext Assist w/ dressing, dependent w/ bathing. Ambulates w/ walker. Frequently incontinent of urine, continent with bowels. Exelon was discontinued in the last month, patient appears to be more disoriented, will restart this medication. Will give this medicine approximately one month if no improvement, will stop it again.

## 2012-12-11 NOTE — Progress Notes (Signed)
Patient ID: [name], female   DOB: 06/27/1915, 79 y.o.   MRN: [id number] [facility] (13)  Code Status: Living Will, Full Code      Contact Information   Name Relation Home Work [place] Daughter [phone]  [phone]      Chief Complaint  Patient presents with  . Medical Managment of Chronic Issues    HPI: This is a 79 y.o. female resident of [facility],  Memory Care section evaluated today for management of ongoing medical issues.  Medication list was reviewed and simplified last month at daughter's request. Exelon was tapered and discontinued, metformin, Plavix and ICaps were discontinued.  Review of record shows patient's daughter was here last night and noted her mother with significantly increased disorientation, confusion and anxiety. This is related to patient insisting that her parents are alive and she wanted to visit them. Daughter asks whether Exelon patch can be restarted.  Patient's blood sugar readings have been satisfactory for the last 2 weeks. Blood pressure noted with a low reading in the record last week,  better today.   Some decline in functional status noted prior to d/c Exelon (dressing, UI), no significant change in cognitive status or mood. Interactions are limited due to combination of dementia, very low vision and poor hearing. Patient continues to have repetitive verbal behaviors, best managed with one-to-one interventions.   Functional Status Bathing: Dependent Bed Mobility: Minimal Assist Bladder Management: Freq. Occ Incont, Pad/diapers, Bowel Management: Continent Feeding: Minimal Assist Hygiene and Grooming: Minimal Assist, Toileting / Clothing: Extensive Assist Transfers: Minimal Assist, Walk: Minimal Assist    Allergies  Allergen Reactions  . Bupropion   . Ciprocinonide [Fluocinolone]   . Codeine   . Halcion [Triazolam]   . Nortriptyline   . Phosphate   . Septra  [Sulfamethoxazole-Trimethoprim]   . Sulfa Antibiotics   . Ultram [Tramadol]    Medications Reviewed  DATA REVIEWED  Radiologic Exams:   Cardiovascular Exams:   Laboratory Studies:   [facility] lab 09/08/2011 WBC 10.5, Hgb 13.2, Hct 39.3, Plt 381 Glu 88, BUN 14, Cr .84, Na 140, K+ 4.4, alb 3.4. LFTsWNL TSH 3.450 HgbA1c 6.4 01/31/2012: WBC 11.7, hemoglobin 14.8, hematocrit 42.5, platelets 433  A1c 6.6 09/13/2012 WBC 10.6, hemoglobin 14.1, hematocrit 40.3, platelets 394  Glucose 93, BUN 17, creatinine 0.93, sodium 141, potassium 4.7. LFTs/proteins WNL  TSH 3.125  A1c 6.4  Review of Systems   DATA OBTAINED: from patient, nurse, medical record, ( patient is poor historian) GENERAL: Feels well   No fevers, fatigue, change in appetite or weight SKIN: No itch, rash or open wounds EYES: No eye pain, dryness or itching   Nearly Blind EARS: No earache, change in hearing     Very HOH NOSE: No congestion, drainage or bleeding MOUTH/THROAT: No mouth or tooth pain  No sore throat No difficulty chewing or swallowing RESPIRATORY: No cough, wheezing, SOB CARDIAC: No chest pain, palpitations  No edema. GI: No abdominal pain  No N/V/D or constipation  No heartburn or reflux  GU: No dysuria, frequency or urgency  No change in urine volume or character Frequent incontinence MUSCULOSKELETAL: No joint pain, swelling or stiffness  No back pain  No muscle ache, pain, weakness  Gait is unsteady  No recent falls.  NEUROLOGIC: No dizziness, fainting, headache, No change in mental status (dementia).  PSYCHIATRIC: Frequent anxiety. Sleeps well.  Ongoing repetitive verbal behavior.    Physical Exam Filed Vitals:   12/11/12 1422  BP: 139/67  Pulse: 67  Weight: 121 lb 8 oz (55.112 kg)   Body mass index is 23.73 kg/(m^2).  GENERAL APPEARANCE: No acute distress, appropriately groomed, frail body habitus. Awake, less alert than ususal, pleasant, conversation is limited. SKIN: No diaphoresis, rash,  unusual lesions, wounds HEAD: Normocephalic, atraumatic EYES: Conjunctiva/lids clear.   EARS: Very poor hearing . NOSE: No deformity or discharge. MOUTH/THROAT: Lips w/o lesions. Oral mucosa, tongue moist, w/o lesion. Oropharynx w/o redness or lesions.  NECK: Supple, full ROM. No thyroid tenderness, enlargement or nodule LYMPHATICS: No head, neck or supraclavicular adenopathy RESPIRATORY: Breathing is even, unlabored. Lung sounds are clear and full.  CARDIOVASCULAR: Heart RRR. No murmur or extra heart sounds  ARTERIAL: No carotid bruit.   VENOUS: No varicosities. No venous stasis skin changes  EDEMA: No peripheral edema.  GASTROINTESTINAL: Abdomen is soft, non-tender, not distended w/ normal bowel sounds. MUSCULOSKELETAL: Moves all extremities with full ROM, strength and tone.  Gait is unsteady, short steps, uses walker NEUROLOGIC: Not oriented to time, place,Speech clear, no tremor.   PSYCHIATRIC: Mood and affect appropriate to situation  ASSESSMENT/PLAN  Alzheimer's disease Most recent MDS reviewed from 09/2012:Not able to participate in BIMS, Staff assessment: short/long term memory impairment, pt. Is no longer able to recall where she lives (nursing home), Is able to recall her room, staff names, has severely impaired decision making capacity. Functional status: limited assistance w. Most ADLs, Ext Assist w/ dressing, dependent w/ bathing. Ambulates w/ walker. Frequently incontinent of urine, continent with bowels. Exelon was discontinued in the last month, patient appears to be more disoriented, will restart this medication. Will give this medicine approximately one month if no improvement, will stop it again.   Major depressive disorder, recurrent episode, severe, without mention of psychotic behavior Mood and behavior is essentially unchanged. Continue current medications and one-on-one interventions  Type II or unspecified type diabetes mellitus without mention of complication, not  stated as uncontrolled Metformin was stopped last month, most recent CBGs are satisfactory. Continue to monitor weekly and check A1c at intervals.  Unspecified essential hypertension Patient receives enalapril and amlodipine for blood pressure management. Recent blood pressure and record 83/60. Today 139/67. Last nursing staff to do daily blood pressure measurements to be sure she is not experiencing hypotension. Most recent laboratory studies satisfactory.    Follow up: Routine or as needed   Tekeya Geffert T.Talasia Saulter, NP-C 12/11/2012

## 2013-02-22 ENCOUNTER — Non-Acute Institutional Stay: Payer: Medicare Other | Admitting: Geriatric Medicine

## 2013-02-22 ENCOUNTER — Encounter: Payer: Self-pay | Admitting: Geriatric Medicine

## 2013-02-22 DIAGNOSIS — J189 Pneumonia, unspecified organism: Secondary | ICD-10-CM

## 2013-02-22 NOTE — Assessment & Plan Note (Addendum)
Clinical appearance and chest x-ray consistent with pneumonia. Continue p.o. antibiotic, encourage fluids as she tolerates, supplemental oxygen as needed to keep O2 saturation greater than 90%. Had discussion with patient's son Ed, patient's  HCPOA, regarding his wishes should his mothers condition worsen. Specifically, how aggressive he would like to be in terms of respiratory interventions and/or hospitalization in this 79yo woman with moderate-severe dementia. Patient is currently full CODE STATUS. She is currently stable but would like to have a plan in place prior to crisis situation. Ed understands this and agrees,  plans to discuss with his siblings. Will get back to me.

## 2013-02-22 NOTE — Progress Notes (Signed)
Patient ID: [name], female   DOB: [birth year], 79 y.o.   MRN: [id number] [facility] (13)  Code Status: Living Will, Full Code  Contact Information   Name Relation Home Work [place] Daughter [phone]  [phone]      Chief Complaint  Patient presents with  . Pneumonia    HPI: This is a 79 y.o. female resident of [facility],  Memory Care section evaluated today due to new diagnosis of pneumonia. Yesterday, patient was noted to be lethargic, poor PO intake and low O2 saturation. OnCall provider was notified who ordered CXR and supplemental O2. X-ray returned with evidence of pneumonia; pateint was started on course of PO antibiotic (Avelox). Today, patient remains in bed with very poor appetite is taking fluids with great encouragement. Vital signs remained stable though O2 saturation remains borderline.    Allergies  Allergen Reactions  . Bupropion   . Ciprocinonide [Fluocinolone]   . Codeine   . Halcion [Triazolam]   . Nortriptyline   . Phosphate   . Septra [Sulfamethoxazole-Trimethoprim]   . Sulfa Antibiotics   . Ultram [Tramadol]    Medications Reviewed  DATA REVIEWED  Radiologic Exams 02/21/2013  Chest x-ray: right infrahilar infiltrate with cardiomegaly  Cardiovascular Exams:   Laboratory Studies:   [facility] lab 09/08/2011 WBC 10.5, Hgb 13.2, Hct 39.3, Plt 381 Glu 88, BUN 14, Cr .84, Na 140, K+ 4.4, alb 3.4. LFTsWNL TSH 3.450 HgbA1c 6.4 01/31/2012: WBC 11.7, hemoglobin 14.8, hematocrit 42.5, platelets 433  A1c 6.6 09/13/2012 WBC 10.6, hemoglobin 14.1, hematocrit 40.3, platelets 394  Glucose 93, BUN 17, creatinine 0.93, sodium 141, potassium 4.7. LFTs/proteins WNL  TSH 3.125  A1c 6.4 Review of Systems   DATA OBTAINED: from patient, nurse, medical record, ( patient is poor historian) GENERAL:"I don't feel well", decreased activity and appetite SKIN: No itch, rash or open  wounds EYES: No eye pain, dryness or itching   Nearly Blind EARS: No earache, change in hearing     Very HOH NOSE: No congestion, drainage or bleeding MOUTH/THROAT: No mouth or tooth pain    No sore throat    No difficulty chewing or swallowing RESPIRATORY: Cough present, No wheezing, SOB CARDIAC: No chest pain,  No edema. GI: No abdominal pain  No N/V/D or constipation    GU: No dysuria, frequency or urgency  No change in urine volume or character Frequent incontinence NEUROLOGIC: No dizziness, fainting, headache, decreased level of alertness PSYCHIATRIC: Frequent anxiety. Sleeps well.   No repetitive verbal behavior today(unusual).    Physical Exam Filed Vitals:   02/22/13 1052  BP: 117/75  Pulse: 67  Temp: 98.6 F (37 C)  Resp: 20  SpO2: 91%   There is no weight on file to calculate BMI.  GENERAL APPEARANCE: No acute distress, appropriately groomed, frail body habitus. Asleep, arouses briefly, follows simple commands SKIN: No diaphoresis, rash,  HEAD: Normocephalic, atraumatic EARS: Very poor hearing . MOUTH/THROAT: Lips w/o lesions. Oral mucosa, tongue moist, w/o lesion. Oropharynx w/o redness or lesions.  LYMPHATICS: No head, neck or supraclavicular adenopathy RESPIRATORY: Breathing is even, unlabored. Lungs with rhonchi on the right, clear on the left CARDIOVASCULAR: Heart RRR. No murmur or extra heart sounds  EDEMA: No peripheral edema.  GASTROINTESTINAL: Abdomen is soft, non-tender, not distended w/ normal bowel sounds. NEUROLOGIC: Unable to assess orientation.   PSYCHIATRIC: Withdrawn   ASSESSMENT/PLAN  HCAP (healthcare-associated pneumonia) Clinical appearance and chest x-ray consistent with pneumonia. Continue p.o. antibiotic, encourage  fluids as she tolerates, supplemental oxygen as needed to keep O2 saturation greater than 90%. Had discussion with patient's son Ed, patient's  HCPOA, regarding his wishes should his mothers condition worsen. Specifically, how  aggressive he would like to be in terms of respiratory interventions and/or hospitalization in this 79yo woman with moderate-severe dementia. Patient is currently full CODE STATUS. She is currently stable but would like to have a plan in place prior to crisis situation. Ed understands this and agrees,  plans to discuss with his siblings. Will get back to me.    Follow up: Routine or as needed   Lauria Depoy T.Boss Danielsen, NP-C 02/22/2013

## 2013-05-07 ENCOUNTER — Non-Acute Institutional Stay: Payer: Medicare Other | Admitting: Geriatric Medicine

## 2013-05-07 ENCOUNTER — Encounter: Payer: Self-pay | Admitting: Geriatric Medicine

## 2013-05-07 DIAGNOSIS — I10 Essential (primary) hypertension: Secondary | ICD-10-CM

## 2013-05-07 DIAGNOSIS — F0280 Dementia in other diseases classified elsewhere without behavioral disturbance: Secondary | ICD-10-CM

## 2013-05-07 DIAGNOSIS — R1312 Dysphagia, oropharyngeal phase: Secondary | ICD-10-CM

## 2013-05-07 DIAGNOSIS — E119 Type 2 diabetes mellitus without complications: Secondary | ICD-10-CM

## 2013-05-07 DIAGNOSIS — G309 Alzheimer's disease, unspecified: Secondary | ICD-10-CM

## 2013-05-07 DIAGNOSIS — J189 Pneumonia, unspecified organism: Secondary | ICD-10-CM

## 2013-05-07 DIAGNOSIS — Z7189 Other specified counseling: Secondary | ICD-10-CM

## 2013-05-07 NOTE — Progress Notes (Signed)
Patient ID: Emily FesterKatherine H Poteat, female   DOB: 11/23/1915, 80 y.o.   MRN: [id number] [facility] ([facility]  Code Status: Living Will, Full Code  Contact Information   Name Relation Home Work [place]   Ivey,Kay Daughter [phone]  [phone]      Chief Complaint  Patient presents with  . Medical Managment of Chronic Issues    Annual update    HPI: This is a 80 y.o. female resident of [facility],  Memory Care section evaluated today for management of ongoing medical issues.   Recent visits: 02/22/2013 HCAP (healthcare-associated pneumonia) Clinical appearance and chest x-ray consistent with pneumonia. Continue p.o. antibiotic, encourage fluids as she tolerates, supplemental oxygen as needed to keep O2 saturation greater than 90%. Had discussion with patient's son Ed, patient's  HCPOA, regarding his wishes should his mothers condition worsen. Specifically, how aggressive he would like to be in terms of respiratory interventions and/or hospitalization in this 80yo woman with moderate-severe dementia. Patient is currently full CODE STATUS. She is currently stable but would like to have a plan in place prior to crisis situation. Ed understands this and agrees,  plans to discuss with his siblings. Will get back to me.  12/11/2012 Alzheimer's disease Most recent MDS reviewed from 09/2012:Not able to participate in BIMS, Staff assessment: short/long term memory impairment, pt. Is no longer able to recall where she lives (nursing home), Is able to recall her room, staff names, has severely impaired decision making capacity. Functional status: limited assistance w. Most ADLs, Ext Assist w/ dressing, dependent w/ bathing. Ambulates w/ walker. Frequently incontinent of urine, continent with bowels. Exelon was discontinued in the last month, patient appears to be more disoriented, will restart this medication. Will give this medicine approximately one month  if no improvement, will stop it again.   Major depressive disorder, recurrent episode, severe, without mention of psychotic behavior Mood and behavior is essentially unchanged. Continue current medications and one-on-one interventions  Type II or unspecified type diabetes mellitus without mention of complication, not stated as uncontrolled Metformin was stopped last month, most recent CBGs are satisfactory. Continue to monitor weekly and check A1c at intervals.  Unspecified essential hypertension Patient receives enalapril and amlodipine for blood pressure management. Recent blood pressure and record 83/60. Today 139/67. Last nursing staff to do daily blood pressure measurements to be sure she is not experiencing hypotension. Most recent laboratory studies satisfactory.  Since last visit patient's respiratory status has improved; acute infection resolved. Patient does continue to have intermittent low O2 saturations requiring supplemental oxygen. Her activity level has returned to baseline.  I did have further discussion with patient's son regarding CODE STATUS, he requests his mother to remain a full code at this time. Patient's mood / behaviors is without significant change; she continues to have intermittent repetitive verbalizations sometimes yelling out. These episodes are less severe than in the past. Dr. Donell BeersPlovsky last saw patient in February 2015, he has discharged her from further psychiatric consultations. No recent changes in medications were made.  Review of facility record shows patient's vital signs have been stable, weekly fasting blood sugar readings less than 140, no medications.  Patient's  p.o. intake is good, usually greater than 50% at most meals, she is accepting the boost nutritional supplement daily. No recent episodes of coughing or choking with meals, continuous mechanical soft diet. Annual MDS review was completed in January 2015, no significant changes were evident..      Allergies  Allergen Reactions  .  Bupropion   . Ciprocinonide [Fluocinolone]   . Codeine   . Halcion [Triazolam]   . Nortriptyline   . Phosphate   . Septra [Sulfamethoxazole-Trimethoprim]   . Sulfa Antibiotics   . Ultram [Tramadol]    MEDICATION-  Reviewed  DATA REVIEWED  Radiologic Exams 02/21/2013  Chest x-ray: right infrahilar infiltrate with cardiomegaly  Cardiovascular Exams:   Laboratory Studies:   [facility] lab 09/08/2011 WBC 10.5, Hgb 13.2, Hct 39.3, Plt 381 Glu 88, BUN 14, Cr .84, Na 140, K+ 4.4, alb 3.4. LFTsWNL TSH 3.450 HgbA1c 6.4 01/31/2012: WBC 11.7, hemoglobin 14.8, hematocrit 42.5, platelets 433  A1c 6.6 09/13/2012 WBC 10.6, hemoglobin 14.1, hematocrit 40.3, platelets 394  Glucose 93, BUN 17, creatinine 0.93, sodium 141, potassium 4.7. LFTs/proteins WNL  TSH 3.125  A1c 6.4   REVIEW OF SYSTEMS   DATA OBTAINED: from patient, nurse, medical record, ( patient is poor historian) GENERAL:"I'M ok",  No recent fever, change in activity or appetite SKIN: No itch, rash or open wounds EYES: No eye pain, dryness or itching   Nearly Blind EARS: No earache, change in hearing     Very [facility] NOSE: No congestion, drainage or bleeding MOUTH/THROAT: No mouth or tooth pain    No sore throat    No difficulty chewing or swallowing RESPIRATORY:  No cough, wheezing, SOB.  CARDIAC: No chest pain,  No edema. GI: No abdominal pain  No N/V/D or constipation    GU: No dysuria, frequency or urgency  No change in urine volume or character Frequent incontinence NEUROLOGIC: No dizziness, fainting, headache, usual LOC/mental status(dementia) PSYCHIATRIC: Frequent anxiety. Sleeps well.   No repetitive verbal behavior today(unusual).    PHYSICAL EXAM  Filed Vitals:   05/07/13 1752  BP: 103/65  Pulse: 69  Temp: 97.6 F (36.4 C)  Resp: 16  Weight: 117 lb 8 oz (53.298 kg)   Body mass index is 22.95 kg/(m^2).  GENERAL APPEARANCE: No acute distress, appropriately groomed, alert,  pleasant, conversant SKIN: No diaphoresis, rash,  HEAD: Normocephalic, atraumatic EARS: Very poor hearing . MOUTH/THROAT: Lips w/o lesions. Oral mucosa, tongue moist, w/o lesion. Oropharynx w/o redness or lesions.  LYMPHATICS: No head, neck or supraclavicular adenopathy RESPIRATORY: Breathing is even, unlabored. Lungs clear, O2 Sat 90-95% RA while conversing CARDIOVASCULAR: Heart RRR. No murmur or extra heart sounds  EDEMA: No peripheral edema.  GASTROINTESTINAL: Abdomen is soft, non-tender, not distended w/ normal bowel sounds. NEUROLOGIC:  not oriented to time or place(usual) , oriented to person. Speech is clear, no tremor.   PSYCHIATRIC:  mood and affect appropriate to situation  ASSESSMENT/PLAN  Unspecified essential hypertension Weekly blood pressure range 103-138/60-71. Continue current medication, update lab  Dysphagia, oropharyngeal phase Diet mechanically maintenance mechanical soft, no other limitations. No recent signs of swallowing difficulty.  HCAP (healthcare-associated pneumonia) Acute infection is resolved, patient does have residual intermittent hypoxia. Continue p.r.n. use of supplemental oxygen  Type II or unspecified type diabetes mellitus without mention of complication, not stated as uncontrolled Fasting CBGs remained satisfactory off metformin. Update lab  Alzheimer's disease Annual MDS 02/2013: Patient unable to participate in BIMS or PHQ-9. Staff report indicates the patient has severe deficit regarding cognitive status and daily decision making. She requires assistance with all ADLs including ambulation, some of this assistance is required due to her low vision. She is frequently incontinent of bowels and bladder. She has limited involvement in unit activities. Family continues to be very involved, daughter visits nearly daily.  Advanced care planning/counseling discussion 2 recent discussions  with patient's son regarding CODE STATUS for this 80 year old  female. Benefits and burdens of CPR were reviewed, he continues to request his mother remain as CODE STATUS.    Follow up: Routine or as needed  Lab/tests: CBC,CMP,TSH, A1C   Toribio Harbour, NP-C [facility] [phone]  05/07/2013

## 2013-05-09 LAB — CBC AND DIFFERENTIAL
HCT: 39 % (ref 36–46)
Hemoglobin: 12.8 g/dL (ref 12.0–16.0)
Neutrophils Absolute: 39 /uL
Platelets: 437 10*3/uL — AB (ref 150–399)
WBC: 12.4 10^3/mL

## 2013-05-09 LAB — HEPATIC FUNCTION PANEL
ALK PHOS: 84 U/L (ref 25–125)
ALT: 13 U/L (ref 7–35)
AST: 19 U/L (ref 13–35)
Bilirubin, Total: 0.3 mg/dL

## 2013-05-09 LAB — BASIC METABOLIC PANEL
BUN: 14 mg/dL (ref 4–21)
CREATININE: 0.8 mg/dL (ref 0.5–1.1)
Glucose: 90 mg/dL
Potassium: 4.4 mmol/L (ref 3.4–5.3)
SODIUM: 139 mmol/L (ref 137–147)

## 2013-05-09 LAB — TSH: TSH: 4.89 u[IU]/mL (ref 0.41–5.90)

## 2013-05-09 LAB — HEMOGLOBIN A1C: Hgb A1c MFr Bld: 6.5 % — AB (ref 4.0–6.0)

## 2013-05-10 DIAGNOSIS — Z7189 Other specified counseling: Secondary | ICD-10-CM | POA: Insufficient documentation

## 2013-05-10 NOTE — Assessment & Plan Note (Signed)
Diet mechanically maintenance mechanical soft, no other limitations. No recent signs of swallowing difficulty.

## 2013-05-10 NOTE — Assessment & Plan Note (Signed)
Weekly blood pressure range 103-138/60-71. Continue current medication, update lab

## 2013-05-10 NOTE — Assessment & Plan Note (Signed)
Annual MDS 02/2013: Patient unable to participate in BIMS or PHQ-9. Staff report indicates the patient has severe deficit regarding cognitive status and daily decision making. She requires assistance with all ADLs including ambulation, some of this assistance is required due to her low vision. She is frequently incontinent of bowels and bladder. She has limited involvement in unit activities. Family continues to be very involved, daughter visits nearly daily.

## 2013-05-10 NOTE — Assessment & Plan Note (Signed)
Acute infection is resolved, patient does have residual intermittent hypoxia. Continue p.r.n. use of supplemental oxygen

## 2013-05-10 NOTE — Assessment & Plan Note (Signed)
Fasting CBGs remained satisfactory off metformin. Update lab

## 2013-05-10 NOTE — Assessment & Plan Note (Signed)
2 recent discussions with patient's son regarding CODE STATUS for this 80 year old female. Benefits and burdens of CPR were reviewed, he continues to request his mother remain as CODE STATUS.

## 2013-07-09 ENCOUNTER — Non-Acute Institutional Stay: Payer: Medicare Other | Admitting: Geriatric Medicine

## 2013-07-09 ENCOUNTER — Encounter: Payer: Self-pay | Admitting: Geriatric Medicine

## 2013-07-09 DIAGNOSIS — I10 Essential (primary) hypertension: Secondary | ICD-10-CM

## 2013-07-09 DIAGNOSIS — E119 Type 2 diabetes mellitus without complications: Secondary | ICD-10-CM

## 2013-07-09 DIAGNOSIS — J189 Pneumonia, unspecified organism: Secondary | ICD-10-CM

## 2013-07-09 DIAGNOSIS — F0280 Dementia in other diseases classified elsewhere without behavioral disturbance: Secondary | ICD-10-CM

## 2013-07-09 DIAGNOSIS — F332 Major depressive disorder, recurrent severe without psychotic features: Secondary | ICD-10-CM

## 2013-07-09 DIAGNOSIS — G309 Alzheimer's disease, unspecified: Secondary | ICD-10-CM

## 2013-07-09 NOTE — Assessment & Plan Note (Signed)
Weekly CBG satisfactory, recent A1C as well. No medication required

## 2013-07-09 NOTE — Progress Notes (Signed)
Patient ID: [name], female   DOB: 05/18/1915, 80 y.o.   MRN: [id number] [facility] ([facility]  Code Status: Living Will, Full Code  Contact Information   Name Relation Home Work [employer]   Ivey,Kay Daughter [phone]  [phone]      Chief Complaint  Patient presents with  . Medical Management of Chronic Issues    HPI: This is a 80 y.o. female resident of [facility],  Memory Care section evaluated today for management of ongoing medical issues.   Last visit: Unspecified essential hypertension Weekly blood pressure range 103-138/60-71. Continue current medication, update lab Dysphagia, oropharyngeal phase Diet mechanically maintenance mechanical soft, no other limitations. No recent signs of swallowing difficulty. HCAP (healthcare-associated pneumonia) Acute infection is resolved, patient does have residual intermittent hypoxia. Continue p.r.n. use of supplemental oxygen Type II or unspecified type diabetes mellitus without mention of complication, not stated as uncontrolled Fasting CBGs remained satisfactory off metformin. Update lab Alzheimer's disease Annual MDS 02/2013: Patient unable to participate in BIMS or PHQ-9. Staff report indicates the patient has severe deficit regarding cognitive status and daily decision making. She requires assistance with all ADLs including ambulation, some of this assistance is required due to her low vision. She is frequently incontinent of bowels and bladder. She has limited involvement in unit activities. Family continues to be very involved, daughter visits nearly daily. Advanced care planning/counseling discussion 2 recent discussions with patient's son regarding CODE STATUS for this 80 year old female. Benefits and burdens of CPR were reviewed, he continues to request his mother remain as CODE STATUS.  Since last visit no acute medical issues. All lab results satisfactory. Respiratory  status has improved; no longer requiring supplemental O2. Review of facility record shows stable vital signs, weight and weekly CBGs. Patient continues with fair PO intake; 50% of most meals. Quarterly Care plan/ MDS review completed 06/28/2013; no chages reported in cognitive or functional status. patient continues to have periods of increased anxiety usually responds to 1:1 intervention.    Allergies  Allergen Reactions  . Bupropion   . Ciprocinonide [Fluocinolone]   . Codeine   . Halcion [Triazolam]   . Nortriptyline   . Phosphate   . Septra [Sulfamethoxazole-Trimethoprim]   . Sulfa Antibiotics   . Ultram [Tramadol]    MEDICATION-  Reviewed  DATA REVIEWED  Radiologic Exams 02/21/2013  Chest x-ray: right infrahilar infiltrate with cardiomegaly  Cardiovascular Exams:   Laboratory Studies:   [facility] lab 09/13/2012 WBC 10.6, hemoglobin 14.1, hematocrit 40.3, platelets 394  Glucose 93, BUN 17, creatinine 0.93, sodium 141, potassium 4.7. LFTs/proteins WNL  TSH 3.125  A1c 6.4 Lab Results  Component Value Date   WBC 12.4 05/09/2013   HGB 12.8 05/09/2013   HCT 39 05/09/2013   PLT 437* 05/09/2013   Lab Results  Component Value Date   NA 139 05/09/2013   K 4.4 05/09/2013   GLU 90 05/09/2013   BUN 14 05/09/2013   CREATININE 0.8 05/09/2013    Lab Results  Component Value Date   TSH 4.89 05/09/2013   Lab Results  Component Value Date   HGBA1C 6.5* 05/09/2013     REVIEW OF SYSTEMS   DATA OBTAINED: from patient, nurse, medical record, ( patient is poor historian) GENERAL:"I'M ok",  No recent fever, change in activity or appetite SKIN: No itch, rash or open wounds EYES: No eye pain, dryness or itching   Nearly Blind EARS: No earache, change in hearing     [facility]  NOSE: No congestion, drainage or bleeding MOUTH/THROAT: No mouth or tooth pain    No sore throat    No difficulty chewing or swallowing (mechanical soft diet) RESPIRATORY:  No cough, wheezing, SOB.  CARDIAC: No  chest pain,  No edema. GI: No abdominal pain  No N/V/D or constipation  Frequent incontinence GU: No dysuria, frequency or urgency  No change in urine volume or character Frequent incontinence NEUROLOGIC: No dizziness, fainting, headache, usual LOC/mental status(dementia) PSYCHIATRIC: Frequent anxiety. Sleeps well.   No repetitive verbal behavior today(unusual).    PHYSICAL EXAM  Filed Vitals:   07/09/13 1018  BP: 128/57  Pulse: 60  Temp: 98 F (36.7 C)  Resp: 18  Weight: 123 lb 8 oz (56.019 kg)  SpO2: 94%   Body mass index is 24.12 kg/(m^2).  GENERAL APPEARANCE: No acute distress, appropriately groomed, alert, pleasant, conversant SKIN: No diaphoresis, rash,  HEAD: Normocephalic, atraumatic EARS: Very poor hearing . MOUTH/THROAT: Lips w/o lesions. Oral mucosa, tongue moist, w/o lesion. Oropharynx w/o redness or lesions.  LYMPHATICS: No head, neck or supraclavicular adenopathy RESPIRATORY: Breathing is even, unlabored. Lungs clear CARDIOVASCULAR: Heart RRR. No murmur or extra heart sounds  EDEMA: No peripheral edema.  GASTROINTESTINAL: Abdomen is soft, non-tender, not distended w/ normal bowel sounds. NEUROLOGIC:  not oriented to time or place(usual) , oriented to person. Speech is clear, no tremor.   PSYCHIATRIC:  mood and affect appropriate to situation  ASSESSMENT/PLAN  Unspecified essential hypertension Weekly BP range satisfactory;116-160/57-76, P 60-74, lab satisfactory. Continue current medication  HCAP (healthcare-associated pneumonia) Resolved, no longer requires supplemental O2  Type II or unspecified type diabetes mellitus without mention of complication, not stated as uncontrolled Weekly CBG satisfactory, recent A1C as well. No medication required  Alzheimer's disease Quarterly Care plan/MDS 06/2013 reviewed, no change from previous:Patient unable to participate in BIMS or PHQ-9. Staff report indicates the patient has severe deficit regarding cognitive status  and daily decision making. She requires assistance with all ADLs including ambulation, some of this assistance is required due to her low vision. She is frequently incontinent of bowels and bladder. She has limited involvement in unit activities. Family continues to be very involved, daughter visits nearly daily   Major depressive disorder, recurrent episode, severe, without mention of psychotic behavior Mood and behavior unchanged; patient exhibits anxiety requiring 1:1 interventions. Groupo activities limited due to frequent verbal outbursts. Daughter / other family members continue to visit frequently     Follow up: Routine or as needed  Lab/tests:     Toribio HarbourClaudette T.Tarus Briski, NP-C [facility] [phone]  07/09/2013

## 2013-07-09 NOTE — Assessment & Plan Note (Signed)
Mood and behavior unchanged; patient exhibits anxiety requiring 1:1 interventions. Groupo activities limited due to frequent verbal outbursts. Daughter / other family members continue to visit frequently

## 2013-07-09 NOTE — Assessment & Plan Note (Signed)
Quarterly Care plan/MDS 06/2013 reviewed, no change from previous:Patient unable to participate in BIMS or PHQ-9. Staff report indicates the patient has severe deficit regarding cognitive status and daily decision making. She requires assistance with all ADLs including ambulation, some of this assistance is required due to her low vision. She is frequently incontinent of bowels and bladder. She has limited involvement in unit activities. Family continues to be very involved, daughter visits nearly daily

## 2013-07-09 NOTE — Assessment & Plan Note (Signed)
Weekly BP range satisfactory;116-160/57-76, P 60-74, lab satisfactory. Continue current medication

## 2013-07-09 NOTE — Assessment & Plan Note (Signed)
Resolved, no longer requires supplemental O2

## 2013-08-27 ENCOUNTER — Non-Acute Institutional Stay: Payer: Medicare Other | Admitting: Nurse Practitioner

## 2013-08-27 DIAGNOSIS — M545 Low back pain, unspecified: Secondary | ICD-10-CM

## 2013-08-27 NOTE — Progress Notes (Signed)
Patient ID: [name], female   DOB: 09/22/1915, 80 y.o.   MRN: [id number]    Nursing Home Location:  [facility]   Place of Service: ALF (13)  PCP: Kimber RelicGREEN, ARTHUR G, MD  Allergies  Allergen Reactions  . Bupropion   . Ciprocinonide [Fluocinolone]   . Codeine   . Halcion [Triazolam]   . Nortriptyline   . Phosphate   . Septra [Sulfamethoxazole-Trimethoprim]   . Sulfa Antibiotics   . Ultram [Tramadol]     Chief Complaint  Patient presents with  . Acute Visit    back pain    HPI:  This is a 80 y.o. female resident of [facility],  Memory Care section evaluated today due to back pain per staff. Apparently pt had fall 2 weeks ago and now is complaining of increased back pain at times. Pt already on tylenol TID. Pt with dementia and severely HOH and she does not answer question therefore unable to obtain a proper ROS or HPI    Review of Systems:  Unable to obtain  Past Medical History  Diagnosis Date  . Encounter for long-term (current) use of other medications 09/06/2011  . Closed fracture of other bone of wrist 03/15/2011  . Retention of urine, unspecified 02/24/2011  . Dysphagia, oropharyngeal phase 04/09/2010  . Pneumonitis due to inhalation of food or vomitus 04/06/2010  . Abnormality of gait 08/24/2009  . Iron deficiency anemia, unspecified 07/29/2009  . Personal history of fall 07/29/2009  . Pain in joint, lower leg 06/17/2009  . Type II or unspecified type diabetes mellitus without mention of complication, not stated as uncontrolled 08/18/2008  . Rickets, active 02/08/2008  . Allergic rhinitis due to pollen 07/18/2007  . Lichenification and lichen simplex chronicus 07/18/2007  . Unspecified urinary incontinence 07/18/2007  . Leukoplakia of oral mucosa, including tongue 01/01/2007  . Major depressive disorder, recurrent episode, severe, without mention of psychotic behavior 12/18/2006  . Alzheimer's disease  04/03/2006  . Diverticulosis of colon (without mention of hemorrhage) 02/20/2006  . Unspecified constipation 01/10/2006  . Heartburn 01/10/2006  . Unspecified disease of pericardium 01/01/2206  . Type II or unspecified type diabetes mellitus without mention of complication, uncontrolled 06/13/2005  . Edema 12/20/2004  . Postherpetic polyneuropathy 10/20/2004  . Lumbago 08/18/2004  . Muscle weakness (generalized) 01/16/2004  . Dizziness and giddiness 09/24/2003  . Insomnia, unspecified 09/24/2003  . Pain in limb 09/03/2003  . Pure hypercholesterolemia 07/16/2003  . Anxiety state, unspecified 07/16/2003  . Macular degeneration (senile) of retina, unspecified 07/16/2003  . Coronary atherosclerosis of unspecified type of vessel, native or graft 07/16/2003  . Acute, but ill-defined, cerebrovascular disease 07/16/2003  . Chronic airway obstruction, not elsewhere classified 07/16/2003  . Esophageal reflux 07/16/2003  . Osteoarthrosis, unspecified whether generalized or localized, unspecified site 07/16/2003  . Displacement of cervical intervertebral disc without myelopathy 07/16/2003  . Osteoporosis, unspecified 07/16/2003  . Unspecified hearing loss 03/11/2003  . Unspecified essential hypertension 07/03/2002  . Other specified disease of sebaceous glands 04/15/2002   Past Surgical History  Procedure Laterality Date  . Appendectomy    . Tonsillectomy    . Carpal tunnel release    . Eye surgery Left     cataracts  . Axillary artery - femoral artery bypass graft Right 2005  . Colonoscopy  02/06/2004    nl exam. Suspect tear in diverticula   Social History:   reports that she quit smoking about 11 years ago. She does not have any smokeless tobacco  history on file. Her alcohol and drug histories are not on file.  Family History  Problem Relation Age of Onset  . Stroke Mother   . Heart disease Father     Medications: Patient's Medications  New Prescriptions   No medications on  file  Previous Medications   ACETAMINOPHEN (TYLENOL) 325 MG TABLET    Take 650 mg by mouth. Take two tablets twice  daily as needed for pain.   AMLODIPINE (NORVASC) 10 MG TABLET    Take 10 mg by mouth daily. Take 1 tablet daily to treat high blood pressure.   ASPIRIN EC 81 MG TABLET    Take 1 tablet (81 mg total) by mouth daily.   CALCIUM CARBONATE-VIT D-MIN (CALTRATE 600+D PLUS PO)    Take by mouth daily. Take one tablet twice daily for calcium.   CHLORPHENIRAMINE (CHLOR-TRIMETON) 4 MG TABLET    Take 4 mg by mouth 2 (two) times daily as needed for allergies. Take 1 tablet every 8 hours as needed   DIAZEPAM (VALIUM) 5 MG TABLET    Take 1/2 tablet by mouth once daily.   ENALAPRIL (VASOTEC) 10 MG TABLET    Take 10 mg by mouth daily. Take one daily at 2 pm to control BP.   LACTOSE FREE NUTRITION (BOOST PLUS) LIQD    Take 237 mLs by mouth. One serving once a day   MEMANTINE HCL ER (NAMENDA XR) 14 MG CP24    Take 1 capsule by mouth daily.   MIRTAZAPINE (REMERON) 15 MG TABLET    Take 15 mg by mouth 2 (two) times daily. Take one at 9 am and 4 pm to help with depression.   MULTIPLE VITAMINS-MINERALS (CENTRUM SILVER ADULT 50+ PO)    Take by mouth daily.   OXYBUTYNIN (DITROPAN-XL) 5 MG 24 HR TABLET    Take 5 mg by mouth at bedtime.   PAROXETINE (PAXIL) 40 MG TABLET    Take 40 mg by mouth every morning. Give 1 daily to treat depression/anxiety.   POLYVINYL ALCOHOL (LIQUIFILM TEARS) 1.4 % OPHTHALMIC SOLUTION    1 drop. One drop both eyes twice daily as needed for itchy eyes, every 12 hours as needed   RIVASTIGMINE (EXELON) 9.5 MG/24HR    Place 1 patch (9.5 mg total) onto the skin daily.   SENNA (SENOKOT) 8.6 MG TABLET    Take 1 tablet by mouth daily. Take 1 every night for laxative - hold for loose stool.   SODIUM CHLORIDE (OCEAN) 0.65 % NASAL SPRAY    Place 1 spray into the nose. Give one spray nasal twice a day  Modified Medications   No medications on file  Discontinued Medications   No medications on  file     Physical Exam:  Filed Vitals:   08/27/13 1110  BP: 144/61  Pulse: 64  Temp: 97.6 F (36.4 C)  Resp: 16     GENERAL APPEARANCE: No acute distress, appropriately groomed, alert, pleasant, conversant SKIN: No diaphoresis, rash,  HEAD: Normocephalic, atraumatic EARS: Very poor hearing . RESPIRATORY: Breathing is even, unlabored. Lungs clear CARDIOVASCULAR: Heart RRR. No murmur or extra heart sounds             EDEMA: No peripheral edema.   GASTROINTESTINAL: Abdomen is soft, non-tender, not distended w/ normal bowel sounds. MS: walks with walker, no tenderness to spine or paraspinal muscles on exam NEUROLOGIC:  not oriented to time or place(usual) , oriented to person. Speech is clear, no tremor.   PSYCHIATRIC:  lethargic, pt was sleeping when I entered the room, slow to arouse- staff reports this is common for pt and she sleeps a lot  Assessment/Plan 1. Low back pain, unspecified back pain laterality, with sciatica presence unspecified -with occasional complaints however was non-tender on exam, and with increased lethargy and staff reporting she stays in bed most of the day will not add medication to make more sleepy but will change timing on tylenol to evenly space out doses to every 8 hours.  -tylenol 650 mg every 8 hours for pain

## 2013-08-29 NOTE — Addendum Note (Signed)
Addended by: Claudie ReveringKARAM, Zaion Hreha M on: 08/29/2013 11:38 AM   Modules accepted: Level of Service

## 2013-08-29 NOTE — Addendum Note (Signed)
Addended by: Claudie ReveringKARAM, Loranzo Desha M on: 08/29/2013 12:42 PM   Modules accepted: Level of Service

## 2013-09-22 ENCOUNTER — Encounter (HOSPITAL_COMMUNITY): Payer: Self-pay | Admitting: Emergency Medicine

## 2013-09-22 ENCOUNTER — Inpatient Hospital Stay (HOSPITAL_COMMUNITY)
Admission: EM | Admit: 2013-09-22 | Discharge: 2013-09-25 | DRG: 193 | Disposition: A | Discharge status: Skilled Nursing Facility | Payer: Medicare Other | Attending: Internal Medicine | Admitting: Internal Medicine

## 2013-09-22 DIAGNOSIS — M1990 Unspecified osteoarthritis, unspecified site: Secondary | ICD-10-CM | POA: Diagnosis present

## 2013-09-22 DIAGNOSIS — Z79899 Other long term (current) drug therapy: Secondary | ICD-10-CM

## 2013-09-22 DIAGNOSIS — IMO0001 Reserved for inherently not codable concepts without codable children: Secondary | ICD-10-CM

## 2013-09-22 DIAGNOSIS — R269 Unspecified abnormalities of gait and mobility: Secondary | ICD-10-CM

## 2013-09-22 DIAGNOSIS — I10 Essential (primary) hypertension: Secondary | ICD-10-CM

## 2013-09-22 DIAGNOSIS — W19XXXA Unspecified fall, initial encounter: Secondary | ICD-10-CM | POA: Diagnosis present

## 2013-09-22 DIAGNOSIS — F02818 Dementia in other diseases classified elsewhere, unspecified severity, with other behavioral disturbance: Secondary | ICD-10-CM | POA: Diagnosis present

## 2013-09-22 DIAGNOSIS — J9620 Acute and chronic respiratory failure, unspecified whether with hypoxia or hypercapnia: Secondary | ICD-10-CM | POA: Diagnosis present

## 2013-09-22 DIAGNOSIS — Z87891 Personal history of nicotine dependence: Secondary | ICD-10-CM

## 2013-09-22 DIAGNOSIS — H3530 Unspecified macular degeneration: Secondary | ICD-10-CM

## 2013-09-22 DIAGNOSIS — S0180XA Unspecified open wound of other part of head, initial encounter: Secondary | ICD-10-CM | POA: Diagnosis present

## 2013-09-22 DIAGNOSIS — Z8673 Personal history of transient ischemic attack (TIA), and cerebral infarction without residual deficits: Secondary | ICD-10-CM

## 2013-09-22 DIAGNOSIS — F0281 Dementia in other diseases classified elsewhere with behavioral disturbance: Secondary | ICD-10-CM | POA: Diagnosis present

## 2013-09-22 DIAGNOSIS — F0280 Dementia in other diseases classified elsewhere without behavioral disturbance: Secondary | ICD-10-CM

## 2013-09-22 DIAGNOSIS — Z823 Family history of stroke: Secondary | ICD-10-CM

## 2013-09-22 DIAGNOSIS — Z9181 History of falling: Secondary | ICD-10-CM

## 2013-09-22 DIAGNOSIS — J4489 Other specified chronic obstructive pulmonary disease: Secondary | ICD-10-CM | POA: Diagnosis present

## 2013-09-22 DIAGNOSIS — F332 Major depressive disorder, recurrent severe without psychotic features: Secondary | ICD-10-CM

## 2013-09-22 DIAGNOSIS — J189 Pneumonia, unspecified organism: Principal | ICD-10-CM

## 2013-09-22 DIAGNOSIS — R0902 Hypoxemia: Secondary | ICD-10-CM

## 2013-09-22 DIAGNOSIS — I517 Cardiomegaly: Secondary | ICD-10-CM

## 2013-09-22 DIAGNOSIS — I2510 Atherosclerotic heart disease of native coronary artery without angina pectoris: Secondary | ICD-10-CM | POA: Diagnosis present

## 2013-09-22 DIAGNOSIS — J449 Chronic obstructive pulmonary disease, unspecified: Secondary | ICD-10-CM | POA: Diagnosis present

## 2013-09-22 DIAGNOSIS — Z7982 Long term (current) use of aspirin: Secondary | ICD-10-CM

## 2013-09-22 DIAGNOSIS — E7800 Pure hypercholesterolemia, unspecified: Secondary | ICD-10-CM | POA: Diagnosis present

## 2013-09-22 DIAGNOSIS — Z7189 Other specified counseling: Secondary | ICD-10-CM

## 2013-09-22 DIAGNOSIS — E119 Type 2 diabetes mellitus without complications: Secondary | ICD-10-CM

## 2013-09-22 DIAGNOSIS — R1312 Dysphagia, oropharyngeal phase: Secondary | ICD-10-CM

## 2013-09-22 DIAGNOSIS — K219 Gastro-esophageal reflux disease without esophagitis: Secondary | ICD-10-CM | POA: Diagnosis present

## 2013-09-22 DIAGNOSIS — Z23 Encounter for immunization: Secondary | ICD-10-CM

## 2013-09-22 DIAGNOSIS — K5900 Constipation, unspecified: Secondary | ICD-10-CM

## 2013-09-22 DIAGNOSIS — S0181XA Laceration without foreign body of other part of head, initial encounter: Secondary | ICD-10-CM

## 2013-09-22 DIAGNOSIS — G309 Alzheimer's disease, unspecified: Secondary | ICD-10-CM | POA: Diagnosis present

## 2013-09-22 DIAGNOSIS — Z8249 Family history of ischemic heart disease and other diseases of the circulatory system: Secondary | ICD-10-CM

## 2013-09-22 DIAGNOSIS — Z9981 Dependence on supplemental oxygen: Secondary | ICD-10-CM

## 2013-09-22 DIAGNOSIS — H9190 Unspecified hearing loss, unspecified ear: Secondary | ICD-10-CM

## 2013-09-22 DIAGNOSIS — E1165 Type 2 diabetes mellitus with hyperglycemia: Secondary | ICD-10-CM

## 2013-09-22 DIAGNOSIS — Z8701 Personal history of pneumonia (recurrent): Secondary | ICD-10-CM

## 2013-09-22 DIAGNOSIS — M810 Age-related osteoporosis without current pathological fracture: Secondary | ICD-10-CM | POA: Diagnosis present

## 2013-09-22 DIAGNOSIS — S0990XA Unspecified injury of head, initial encounter: Secondary | ICD-10-CM

## 2013-09-22 HISTORY — DX: History of falling: Z91.81

## 2013-09-22 HISTORY — DX: Laceration without foreign body of other part of head, initial encounter: S01.81XA

## 2013-09-22 HISTORY — DX: Cardiomegaly: I51.7

## 2013-09-22 NOTE — ED Notes (Addendum)
Patient from [facility]. Had un witnessed fall no LOC. Patient has lac to right forehead. Hx dementia.  bp 110/82 hr 80 cbg 151

## 2013-09-23 ENCOUNTER — Emergency Department (HOSPITAL_COMMUNITY): Payer: Medicare Other

## 2013-09-23 ENCOUNTER — Encounter (HOSPITAL_COMMUNITY): Payer: Self-pay | Admitting: *Deleted

## 2013-09-23 DIAGNOSIS — K219 Gastro-esophageal reflux disease without esophagitis: Secondary | ICD-10-CM | POA: Diagnosis present

## 2013-09-23 DIAGNOSIS — E7800 Pure hypercholesterolemia, unspecified: Secondary | ICD-10-CM | POA: Diagnosis present

## 2013-09-23 DIAGNOSIS — M1990 Unspecified osteoarthritis, unspecified site: Secondary | ICD-10-CM | POA: Diagnosis present

## 2013-09-23 DIAGNOSIS — F0281 Dementia in other diseases classified elsewhere with behavioral disturbance: Secondary | ICD-10-CM | POA: Diagnosis present

## 2013-09-23 DIAGNOSIS — J9620 Acute and chronic respiratory failure, unspecified whether with hypoxia or hypercapnia: Secondary | ICD-10-CM | POA: Diagnosis present

## 2013-09-23 DIAGNOSIS — J189 Pneumonia, unspecified organism: Secondary | ICD-10-CM | POA: Diagnosis present

## 2013-09-23 DIAGNOSIS — S0180XA Unspecified open wound of other part of head, initial encounter: Secondary | ICD-10-CM | POA: Diagnosis present

## 2013-09-23 DIAGNOSIS — Z8249 Family history of ischemic heart disease and other diseases of the circulatory system: Secondary | ICD-10-CM | POA: Diagnosis not present

## 2013-09-23 DIAGNOSIS — F332 Major depressive disorder, recurrent severe without psychotic features: Secondary | ICD-10-CM | POA: Diagnosis present

## 2013-09-23 DIAGNOSIS — R1312 Dysphagia, oropharyngeal phase: Secondary | ICD-10-CM

## 2013-09-23 DIAGNOSIS — F0280 Dementia in other diseases classified elsewhere without behavioral disturbance: Secondary | ICD-10-CM

## 2013-09-23 DIAGNOSIS — I2510 Atherosclerotic heart disease of native coronary artery without angina pectoris: Secondary | ICD-10-CM | POA: Diagnosis present

## 2013-09-23 DIAGNOSIS — I10 Essential (primary) hypertension: Secondary | ICD-10-CM | POA: Diagnosis present

## 2013-09-23 DIAGNOSIS — H9190 Unspecified hearing loss, unspecified ear: Secondary | ICD-10-CM | POA: Diagnosis present

## 2013-09-23 DIAGNOSIS — IMO0001 Reserved for inherently not codable concepts without codable children: Secondary | ICD-10-CM | POA: Diagnosis present

## 2013-09-23 DIAGNOSIS — H3530 Unspecified macular degeneration: Secondary | ICD-10-CM | POA: Diagnosis present

## 2013-09-23 DIAGNOSIS — Z23 Encounter for immunization: Secondary | ICD-10-CM | POA: Diagnosis not present

## 2013-09-23 DIAGNOSIS — G309 Alzheimer's disease, unspecified: Secondary | ICD-10-CM | POA: Diagnosis present

## 2013-09-23 DIAGNOSIS — R0902 Hypoxemia: Secondary | ICD-10-CM

## 2013-09-23 DIAGNOSIS — Z823 Family history of stroke: Secondary | ICD-10-CM | POA: Diagnosis not present

## 2013-09-23 DIAGNOSIS — J449 Chronic obstructive pulmonary disease, unspecified: Secondary | ICD-10-CM | POA: Diagnosis present

## 2013-09-23 DIAGNOSIS — M810 Age-related osteoporosis without current pathological fracture: Secondary | ICD-10-CM | POA: Diagnosis present

## 2013-09-23 DIAGNOSIS — E119 Type 2 diabetes mellitus without complications: Secondary | ICD-10-CM

## 2013-09-23 DIAGNOSIS — Z79899 Other long term (current) drug therapy: Secondary | ICD-10-CM | POA: Diagnosis not present

## 2013-09-23 DIAGNOSIS — W19XXXA Unspecified fall, initial encounter: Secondary | ICD-10-CM | POA: Diagnosis present

## 2013-09-23 DIAGNOSIS — Z9181 History of falling: Secondary | ICD-10-CM | POA: Diagnosis not present

## 2013-09-23 DIAGNOSIS — Z8673 Personal history of transient ischemic attack (TIA), and cerebral infarction without residual deficits: Secondary | ICD-10-CM | POA: Diagnosis not present

## 2013-09-23 DIAGNOSIS — F02818 Dementia in other diseases classified elsewhere, unspecified severity, with other behavioral disturbance: Secondary | ICD-10-CM | POA: Diagnosis present

## 2013-09-23 DIAGNOSIS — Z87891 Personal history of nicotine dependence: Secondary | ICD-10-CM | POA: Diagnosis not present

## 2013-09-23 DIAGNOSIS — Z9981 Dependence on supplemental oxygen: Secondary | ICD-10-CM | POA: Diagnosis not present

## 2013-09-23 DIAGNOSIS — Z8701 Personal history of pneumonia (recurrent): Secondary | ICD-10-CM | POA: Diagnosis not present

## 2013-09-23 DIAGNOSIS — Z7982 Long term (current) use of aspirin: Secondary | ICD-10-CM | POA: Diagnosis not present

## 2013-09-23 LAB — CBC WITH DIFFERENTIAL/PLATELET
Basophils Absolute: 0 10*3/uL (ref 0.0–0.1)
Basophils Relative: 0 % (ref 0–1)
Eosinophils Absolute: 0.3 10*3/uL (ref 0.0–0.7)
Eosinophils Relative: 2 % (ref 0–5)
HEMATOCRIT: 40.6 % (ref 36.0–46.0)
Hemoglobin: 13.2 g/dL (ref 12.0–15.0)
LYMPHS PCT: 7 % — AB (ref 12–46)
Lymphs Abs: 1.1 10*3/uL (ref 0.7–4.0)
MCH: 30.6 pg (ref 26.0–34.0)
MCHC: 32.5 g/dL (ref 30.0–36.0)
MCV: 94 fL (ref 78.0–100.0)
MONO ABS: 1.3 10*3/uL — AB (ref 0.1–1.0)
MONOS PCT: 8 % (ref 3–12)
Neutro Abs: 13.2 10*3/uL — ABNORMAL HIGH (ref 1.7–7.7)
Neutrophils Relative %: 83 % — ABNORMAL HIGH (ref 43–77)
Platelets: 457 10*3/uL — ABNORMAL HIGH (ref 150–400)
RBC: 4.32 MIL/uL (ref 3.87–5.11)
RDW: 14 % (ref 11.5–15.5)
WBC: 15.9 10*3/uL — AB (ref 4.0–10.5)

## 2013-09-23 LAB — URINALYSIS, ROUTINE W REFLEX MICROSCOPIC
BILIRUBIN URINE: NEGATIVE
Glucose, UA: NEGATIVE mg/dL
Hgb urine dipstick: NEGATIVE
KETONES UR: NEGATIVE mg/dL
Leukocytes, UA: NEGATIVE
Nitrite: NEGATIVE
Protein, ur: NEGATIVE mg/dL
Specific Gravity, Urine: 1.006 (ref 1.005–1.030)
Urobilinogen, UA: 0.2 mg/dL (ref 0.0–1.0)
pH: 5.5 (ref 5.0–8.0)

## 2013-09-23 LAB — COMPREHENSIVE METABOLIC PANEL
ALT: 15 U/L (ref 0–35)
AST: 22 U/L (ref 0–37)
Albumin: 3.6 g/dL (ref 3.5–5.2)
Alkaline Phosphatase: 127 U/L — ABNORMAL HIGH (ref 39–117)
Anion gap: 17 — ABNORMAL HIGH (ref 5–15)
BUN: 21 mg/dL (ref 6–23)
CALCIUM: 9.5 mg/dL (ref 8.4–10.5)
CO2: 23 mEq/L (ref 19–32)
Chloride: 96 mEq/L (ref 96–112)
Creatinine, Ser: 0.85 mg/dL (ref 0.50–1.10)
GFR, EST AFRICAN AMERICAN: 65 mL/min — AB (ref >90)
GFR, EST NON AFRICAN AMERICAN: 56 mL/min — AB (ref >90)
GLUCOSE: 150 mg/dL — AB (ref 70–99)
Potassium: 4.3 mEq/L (ref 3.7–5.3)
Sodium: 136 mEq/L — ABNORMAL LOW (ref 137–147)
Total Bilirubin: <0.2 mg/dL — ABNORMAL LOW (ref 0.3–1.2)
Total Protein: 7.3 g/dL (ref 6.0–8.3)

## 2013-09-23 LAB — I-STAT TROPONIN, ED
Troponin i, poc: 0.11 ng/mL (ref 0.00–0.08)
Troponin i, poc: 0.09 ng/mL (ref 0.00–0.08)

## 2013-09-23 LAB — PROTIME-INR
INR: 0.96 (ref 0.00–1.49)
Prothrombin Time: 12.8 seconds (ref 11.6–15.2)

## 2013-09-23 LAB — STREP PNEUMONIAE URINARY ANTIGEN: Strep Pneumo Urinary Antigen: NEGATIVE

## 2013-09-23 LAB — TROPONIN I

## 2013-09-23 LAB — APTT: aPTT: 26 seconds (ref 24–37)

## 2013-09-23 LAB — MRSA PCR SCREENING: MRSA BY PCR: NEGATIVE

## 2013-09-23 MED ORDER — ONDANSETRON HCL 4 MG/2ML IJ SOLN: 4.0000 mg | Freq: Three times a day (TID) | INTRAMUSCULAR | Status: DC | PRN

## 2013-09-23 MED ORDER — ASPIRIN EC 81 MG PO TBEC
81.0000 mg | DELAYED_RELEASE_TABLET | Freq: Every day | ORAL | Status: DC
  Administered 2013-09-23 – 2013-09-25 (×3): 81 mg via ORAL
  Filled 2013-09-23 (×3): qty 1

## 2013-09-23 MED ORDER — LEVOFLOXACIN IN D5W 750 MG/150ML IV SOLN: 750.0000 mg | INTRAVENOUS | Status: DC

## 2013-09-23 MED ORDER — HALOPERIDOL LACTATE 5 MG/ML IJ SOLN
2.0000 mg | Freq: Once | INTRAMUSCULAR | Status: AC
  Administered 2013-09-23: 2 mg via INTRAVENOUS
  Filled 2013-09-23: qty 1

## 2013-09-23 MED ORDER — PAROXETINE HCL 20 MG PO TABS
40.0000 mg | ORAL_TABLET | Freq: Every day | ORAL | Status: DC
  Administered 2013-09-23 – 2013-09-25 (×3): 40 mg via ORAL
  Filled 2013-09-23 (×3): qty 2

## 2013-09-23 MED ORDER — ENALAPRIL MALEATE 10 MG PO TABS
10.0000 mg | ORAL_TABLET | Freq: Every day | ORAL | Status: DC
  Administered 2013-09-23 – 2013-09-25 (×3): 10 mg via ORAL
  Filled 2013-09-23 (×3): qty 1

## 2013-09-23 MED ORDER — MIRTAZAPINE 15 MG PO TABS
15.0000 mg | ORAL_TABLET | Freq: Two times a day (BID) | ORAL | Status: DC
  Administered 2013-09-23 – 2013-09-25 (×5): 15 mg via ORAL
  Filled 2013-09-23 (×6): qty 1

## 2013-09-23 MED ORDER — MEMANTINE HCL ER 7 MG PO CP24
14.0000 mg | ORAL_CAPSULE | Freq: Every day | ORAL | Status: DC
  Administered 2013-09-23 – 2013-09-25 (×3): 14 mg via ORAL
  Filled 2013-09-23 (×3): qty 2

## 2013-09-23 MED ORDER — LEVOFLOXACIN IN D5W 750 MG/150ML IV SOLN
750.0000 mg | INTRAVENOUS | Status: DC
  Administered 2013-09-25: 750 mg via INTRAVENOUS
  Filled 2013-09-23: qty 150

## 2013-09-23 MED ORDER — TETANUS-DIPHTH-ACELL PERTUSSIS 5-2.5-18.5 LF-MCG/0.5 IM SUSP
0.5000 mL | Freq: Once | INTRAMUSCULAR | Status: AC
  Administered 2013-09-23: 0.5 mL via INTRAMUSCULAR
  Filled 2013-09-23: qty 0.5

## 2013-09-23 MED ORDER — LEVOFLOXACIN IN D5W 250 MG/50ML IV SOLN
250.0000 mg | Freq: Once | INTRAVENOUS | Status: AC
  Administered 2013-09-23: 250 mg via INTRAVENOUS
  Filled 2013-09-23: qty 50

## 2013-09-23 MED ORDER — VANCOMYCIN HCL IN DEXTROSE 1-5 GM/200ML-% IV SOLN
1000.0000 mg | Freq: Once | INTRAVENOUS | Status: DC
  Filled 2013-09-23: qty 200

## 2013-09-23 MED ORDER — SODIUM CHLORIDE 0.9 % IV SOLN
INTRAVENOUS | Status: DC
  Administered 2013-09-24: 22:00:00 via INTRAVENOUS

## 2013-09-23 MED ORDER — RIVASTIGMINE 9.5 MG/24HR TD PT24
9.5000 mg | MEDICATED_PATCH | Freq: Every day | TRANSDERMAL | Status: DC
  Administered 2013-09-23 – 2013-09-25 (×3): 9.5 mg via TRANSDERMAL
  Filled 2013-09-23 (×3): qty 1

## 2013-09-23 MED ORDER — OXYBUTYNIN CHLORIDE ER 5 MG PO TB24
5.0000 mg | ORAL_TABLET | Freq: Every day | ORAL | Status: DC
  Filled 2013-09-23 (×3): qty 1

## 2013-09-23 MED ORDER — HEPARIN SODIUM (PORCINE) 5000 UNIT/ML IJ SOLN
5000.0000 [IU] | Freq: Three times a day (TID) | INTRAMUSCULAR | Status: DC
  Administered 2013-09-23 – 2013-09-25 (×7): 5000 [IU] via SUBCUTANEOUS
  Filled 2013-09-23 (×10): qty 1

## 2013-09-23 MED ORDER — ACETAMINOPHEN 325 MG PO TABS: 650.0000 mg | ORAL_TABLET | Freq: Four times a day (QID) | ORAL | Status: DC | PRN

## 2013-09-23 MED ORDER — DIAZEPAM 5 MG PO TABS
2.5000 mg | ORAL_TABLET | Freq: Four times a day (QID) | ORAL | Status: DC | PRN
  Administered 2013-09-23 (×2): 2.5 mg via ORAL
  Filled 2013-09-23 (×2): qty 1

## 2013-09-23 MED ORDER — AMLODIPINE BESYLATE 10 MG PO TABS
10.0000 mg | ORAL_TABLET | Freq: Every day | ORAL | Status: DC
  Administered 2013-09-23 – 2013-09-25 (×3): 10 mg via ORAL
  Filled 2013-09-23 (×3): qty 1

## 2013-09-23 MED ORDER — LEVOFLOXACIN IN D5W 500 MG/100ML IV SOLN
500.0000 mg | Freq: Once | INTRAVENOUS | Status: AC
  Administered 2013-09-23: 500 mg via INTRAVENOUS
  Filled 2013-09-23: qty 100

## 2013-09-23 MED ORDER — VANCOMYCIN HCL IN DEXTROSE 750-5 MG/150ML-% IV SOLN
750.0000 mg | INTRAVENOUS | Status: DC
  Administered 2013-09-24 – 2013-09-25 (×2): 750 mg via INTRAVENOUS
  Filled 2013-09-23 (×2): qty 150

## 2013-09-23 MED ORDER — ZOLPIDEM TARTRATE 5 MG PO TABS
5.0000 mg | ORAL_TABLET | Freq: Every day | ORAL | Status: DC
  Administered 2013-09-23: 5 mg via ORAL
  Filled 2013-09-23: qty 1

## 2013-09-23 NOTE — Care Management Note (Addendum)
    Page 1 of 1   09/25/2013     4:11:08 PM CARE MANAGEMENT NOTE 09/25/2013  Patient:  [name],[name]   Account Number:  [id number]  Date Initiated:  09/23/2013  Documentation initiated by:  Lanier ClamMAHABIR,Moira Umholtz  Subjective/Objective Assessment:   80 Y/O F ADMITTED W/FALL,PNA.     Action/Plan:   FROM [facility].   Anticipated DC Date:  09/25/2013   Anticipated DC Plan:  SKILLED NURSING FACILITY      DC Planning Services  CM consult      Choice offered to / List presented to:             Status of service:  Completed, signed off Medicare Important Message given?  NA - LOS <3 / Initial given by admissions (If response is "NO", the following Medicare IM given date fields will be blank) Date Medicare IM given:  09/25/2013 Medicare IM given by:  [facility]Salaam [place] Date Additional Medicare IM given:   Additional Medicare IM given by:    Discharge Disposition:  ASSISTED LIVING  Per UR Regulation:  Reviewed for med. necessity/level of care/duration of stay  If discussed at Long Length of Stay Meetings, dates discussed:    Comments:  09/25/13 Deb Loudin RN,BSN NCM [phone] SPOKE TO DTR KAY ABOUT MEDICARE IM NOTICE.VOICED UNDERSTANDING.D/C ALF.NO FURTHER D/C NEEDS.  09/23/13 Chrissy Ealey RN,BSN NCM [phone] SAFETY SITTER,RESTRAINTS.RECOMMEND PT EVAL WHEN APPROPRIATE.

## 2013-09-23 NOTE — ED Provider Notes (Signed)
CSN: [id number]     Arrival date & time 09/22/13  2337 History   First MD Initiated Contact with Patient 09/23/13 0011     Chief Complaint  Patient presents with  . Fall  . Head Laceration     (Consider location/radiation/quality/duration/timing/severity/associated sxs/prior Treatment) HPI Patient has a history of advanced dementia and is unable to give history. Level V caveat applies. Found by staff from nursing facility on the floor. She had unwitnessed fall. Sustained laceration to the right forehead. Past Medical History  Diagnosis Date  . Encounter for long-term (current) use of other medications 09/06/2011  . Closed fracture of other bone of wrist 03/15/2011  . Retention of urine, unspecified 02/24/2011  . Dysphagia, oropharyngeal phase 04/09/2010  . Pneumonitis due to inhalation of food or vomitus 04/06/2010  . Abnormality of gait 08/24/2009  . Iron deficiency anemia, unspecified 07/29/2009  . Personal history of fall 07/29/2009  . Pain in joint, lower leg 06/17/2009  . Type II or unspecified type diabetes mellitus without mention of complication, not stated as uncontrolled 08/18/2008  . Rickets, active 02/08/2008  . Allergic rhinitis due to pollen 07/18/2007  . Lichenification and lichen simplex chronicus 07/18/2007  . Unspecified urinary incontinence 07/18/2007  . Leukoplakia of oral mucosa, including tongue 01/01/2007  . Major depressive disorder, recurrent episode, severe, without mention of psychotic behavior 12/18/2006  . Alzheimer's disease 04/03/2006  . Diverticulosis of colon (without mention of hemorrhage) 02/20/2006  . Unspecified constipation 01/10/2006  . Heartburn 01/10/2006  . Unspecified disease of pericardium 01/01/2206  . Type II or unspecified type diabetes mellitus without mention of complication, uncontrolled 06/13/2005  . Edema 12/20/2004  . Postherpetic polyneuropathy 10/20/2004  . Lumbago 08/18/2004  . Muscle weakness (generalized) 01/16/2004  .  Dizziness and giddiness 09/24/2003  . Insomnia, unspecified 09/24/2003  . Pain in limb 09/03/2003  . Pure hypercholesterolemia 07/16/2003  . Anxiety state, unspecified 07/16/2003  . Macular degeneration (senile) of retina, unspecified 07/16/2003  . Coronary atherosclerosis of unspecified type of vessel, native or graft 07/16/2003  . Acute, but ill-defined, cerebrovascular disease 07/16/2003  . Chronic airway obstruction, not elsewhere classified 07/16/2003  . Esophageal reflux 07/16/2003  . Osteoarthrosis, unspecified whether generalized or localized, unspecified site 07/16/2003  . Displacement of cervical intervertebral disc without myelopathy 07/16/2003  . Osteoporosis, unspecified 07/16/2003  . Unspecified hearing loss 03/11/2003  . Unspecified essential hypertension 07/03/2002  . Other specified disease of sebaceous glands 04/15/2002   Past Surgical History  Procedure Laterality Date  . Appendectomy    . Tonsillectomy    . Carpal tunnel release    . Eye surgery Left     cataracts  . Axillary artery - femoral artery bypass graft Right 2005  . Colonoscopy  02/06/2004    nl exam. Suspect tear in diverticula   Family History  Problem Relation Age of Onset  . Stroke Mother   . Heart disease Father    History  Substance Use Topics  . Smoking status: Former Smoker    Quit date: 09/11/2001  . Smokeless tobacco: Not on file  . Alcohol Use: Not on file   OB History   Grav Para Term Preterm Abortions TAB SAB Ect Mult Living                 Review of Systems  Unable to perform ROS: Dementia      Allergies  Bupropion; Ciprocinonide; Codeine; Halcion; Nortriptyline; Phosphate; Septra; Sulfa antibiotics; and Ultram  Home Medications   Prior to Admission  medications   Medication Sig Start Date End Date Taking? Authorizing Provider  amLODipine (NORVASC) 10 MG tablet Take 10 mg by mouth daily. Take 1 tablet daily to treat high blood pressure.   Yes Historical Provider, MD   acetaminophen (TYLENOL) 325 MG tablet Take 650 mg by mouth. Take two tablets twice  daily as needed for pain.    Historical Provider, MD  aspirin EC 81 MG tablet Take 1 tablet (81 mg total) by mouth daily. 11/19/12   Claudette Royston Sinner, NP  Calcium Carbonate-Vit D-Min (CALTRATE 600+D PLUS PO) Take by mouth daily. Take one tablet twice daily for calcium.    Historical Provider, MD  chlorpheniramine (CHLOR-TRIMETON) 4 MG tablet Take 4 mg by mouth 2 (two) times daily as needed for allergies. Take 1 tablet every 8 hours as needed    Historical Provider, MD  diazepam (VALIUM) 5 MG tablet Take 1/2 tablet by mouth once daily. 08/14/12   Claudie Revering, NP  enalapril (VASOTEC) 10 MG tablet Take 10 mg by mouth daily. Take one daily at 2 pm to control BP.    Historical Provider, MD  lactose free nutrition (BOOST PLUS) LIQD Take 237 mLs by mouth. One serving once a day    Historical Provider, MD  Memantine HCl ER (NAMENDA XR) 14 MG CP24 Take 1 capsule by mouth daily.    Historical Provider, MD  mirtazapine (REMERON) 15 MG tablet Take 15 mg by mouth 2 (two) times daily. Take one at 9 am and 4 pm to help with depression.    Historical Provider, MD  Multiple Vitamins-Minerals (CENTRUM SILVER ADULT 50+ PO) Take by mouth daily.    Historical Provider, MD  oxybutynin (DITROPAN-XL) 5 MG 24 hr tablet Take 5 mg by mouth at bedtime.    Historical Provider, MD  PARoxetine (PAXIL) 40 MG tablet Take 40 mg by mouth every morning. Give 1 daily to treat depression/anxiety.    Historical Provider, MD  polyvinyl alcohol (LIQUIFILM TEARS) 1.4 % ophthalmic solution 1 drop. One drop both eyes twice daily as needed for itchy eyes, every 12 hours as needed    Historical Provider, MD  rivastigmine (EXELON) 9.5 mg/24hr Place 1 patch (9.5 mg total) onto the skin daily. 12/11/12   Claudette Royston Sinner, NP  senna (SENOKOT) 8.6 MG tablet Take 1 tablet by mouth daily. Take 1 every night for laxative - hold for loose stool.    Historical  Provider, MD  sodium chloride (OCEAN) 0.65 % nasal spray Place 1 spray into the nose. Give one spray nasal twice a day    Historical Provider, MD   BP 142/67  Pulse 67  Temp(Src) 98.1 F (36.7 C) (Oral)  Resp 18  SpO2 91% Physical Exam  Nursing note and vitals reviewed. Constitutional: She appears well-developed and well-nourished.  Repetitive. Not answering questions. Not following commands  HENT:  Head: Normocephalic.  Mouth/Throat: Oropharynx is clear and moist.  6 cm laceration a vertical pattern to the right forehead. Midface is stable  Eyes: EOM are normal. Pupils are equal, round, and reactive to light.  Neck: Normal range of motion. Neck supple.  No posterior midline cervical tenderness to palpation.  Cardiovascular: Normal rate and regular rhythm.  Exam reveals no gallop and no friction rub.   No murmur heard. Pulmonary/Chest: Effort normal and breath sounds normal. No respiratory distress. She has no wheezes. She has no rales. She exhibits no tenderness.  Abdominal: Soft. Bowel sounds are normal. She exhibits no distension and no  mass. There is no tenderness. There is no rebound and no guarding.  Musculoskeletal: Normal range of motion. She exhibits no edema and no tenderness.  The pelvis is stable. No thoracic or lumbar tenderness. Distal pulses intact.  Neurological: She is alert.  Verbal but not answering questions. Moves all extremities spontaneously.  Skin: Skin is warm and dry. No rash noted. No erythema.  Psychiatric: She has a normal mood and affect. Her behavior is normal.    ED Course  Procedures (including critical care time) Labs Review Labs Reviewed  CBC WITH DIFFERENTIAL - Abnormal; Notable for the following:    WBC 15.9 (*)    Platelets 457 (*)    Neutrophils Relative % 83 (*)    Neutro Abs 13.2 (*)    Lymphocytes Relative 7 (*)    Monocytes Absolute 1.3 (*)    All other components within normal limits  I-STAT TROPOININ, ED - Abnormal; Notable for  the following:    Troponin i, poc 0.11 (*)    All other components within normal limits  PROTIME-INR  APTT  COMPREHENSIVE METABOLIC PANEL  URINALYSIS, ROUTINE W REFLEX MICROSCOPIC    Imaging Review No results found.   EKG Interpretation None      MDM   Final diagnoses:  None     Patient with persistent hypoxia. Drop to 84% on room air. Questionable infiltrate versus atelectasis on x-ray. Elevated white blood cell count. Treat as possible acquired pneumonia. Discussed with [facility]. Will admit to telemetry bed.  Loren Racer, MD 09/23/13 [phone]

## 2013-09-23 NOTE — Progress Notes (Signed)
PAtient blood pressure in the 170s scheduled blood pressure medication given and blood pressure rechecked in an hour down to 133, Dr. York SpanielBuriev notified. Will continue to assess patient.

## 2013-09-23 NOTE — Evaluation (Signed)
Clinical/Bedside Swallow Evaluation Patient Details  Name: [name] MRN: [id number] Date of Birth: 07/14/1915  Today's Date: 09/23/2013 Time: 1013-1035 SLP Time Calculation (min): 22 min  Past Medical History:  Past Medical History  Diagnosis Date  . Encounter for long-term (current) use of other medications 09/06/2011  . Closed fracture of other bone of wrist 03/15/2011  . Retention of urine, unspecified 02/24/2011  . Dysphagia, oropharyngeal phase 04/09/2010  . Pneumonitis due to inhalation of food or vomitus 04/06/2010  . Abnormality of gait 08/24/2009  . Iron deficiency anemia, unspecified 07/29/2009  . Personal history of fall 07/29/2009  . Pain in joint, lower leg 06/17/2009  . Type II or unspecified type diabetes mellitus without mention of complication, not stated as uncontrolled 08/18/2008  . Rickets, active 02/08/2008  . Allergic rhinitis due to pollen 07/18/2007  . Lichenification and lichen simplex chronicus 07/18/2007  . Unspecified urinary incontinence 07/18/2007  . Leukoplakia of oral mucosa, including tongue 01/01/2007  . Major depressive disorder, recurrent episode, severe, without mention of psychotic behavior 12/18/2006  . Alzheimer's disease 04/03/2006  . Diverticulosis of colon (without mention of hemorrhage) 02/20/2006  . Unspecified constipation 01/10/2006  . Heartburn 01/10/2006  . Unspecified disease of pericardium 01/01/2206  . Type II or unspecified type diabetes mellitus without mention of complication, uncontrolled 06/13/2005  . Edema 12/20/2004  . Postherpetic polyneuropathy 10/20/2004  . Lumbago 08/18/2004  . Muscle weakness (generalized) 01/16/2004  . Dizziness and giddiness 09/24/2003  . Insomnia, unspecified 09/24/2003  . Pain in limb 09/03/2003  . Pure hypercholesterolemia 07/16/2003  . Anxiety state, unspecified 07/16/2003  . Macular degeneration (senile) of retina, unspecified 07/16/2003  . Coronary atherosclerosis of  unspecified type of vessel, native or graft 07/16/2003  . Acute, but ill-defined, cerebrovascular disease 07/16/2003  . Chronic airway obstruction, not elsewhere classified 07/16/2003  . Esophageal reflux 07/16/2003  . Osteoarthrosis, unspecified whether generalized or localized, unspecified site 07/16/2003  . Displacement of cervical intervertebral disc without myelopathy 07/16/2003  . Osteoporosis, unspecified 07/16/2003  . Unspecified hearing loss 03/11/2003  . Unspecified essential hypertension 07/03/2002  . Other specified disease of sebaceous glands 04/15/2002   Past Surgical History:  Past Surgical History  Procedure Laterality Date  . Appendectomy    . Tonsillectomy    . Carpal tunnel release    . Eye surgery Left     cataracts  . Axillary artery - femoral artery bypass graft Right 2005  . Colonoscopy  02/06/2004    nl exam. Suspect tear in diverticula   HPI:  80 [facility], Pt has lengthy medical hx including Alx dx, pnas, esophageal reflux and per review of notes from facility needs assist for all ADLs.  Premorbid diet is soft.  Swallow evaluation ordered.  CXR showed left base ATX, Pt has h/o right lung infiltrate Dec 2014.  She is legally blind and hearing impaired.     Assessment / Plan / Recommendation Clinical Impression  Pt with mild oral cognitive based dysphagia without symptoms of aspiration/airway compromise.  She consistently spoke during entire evaluation even with food boluses in oral cavity.  Max cues to cease talking beneficial.  Recommend soft/thin with absolute full supervision.  Rn reports good tolerance of medicine with liquids today. Given h/o dysphagia, right lobe pna in Dec and reflux SLP to follow up x1 for tolerance/family education.      Aspiration Risk  Mild    Diet Recommendation Dysphagia 3 (Mechanical Soft);Thin liquid   Liquid Administration via:  Cup;Straw Medication Administration:  (as tolerated) Supervision:  Staff to assist with self feeding;Full supervision/cueing for compensatory strategies Compensations: Slow rate;Small sips/bites Postural Changes and/or Swallow Maneuvers: Seated upright 90 degrees;Upright 30-60 min after meal    Other  Recommendations     Follow Up Recommendations  Skilled Nursing facility    Frequency and Duration min 1 x/week  1 week   Pertinent Vitals/Pain Afebrile, decreased     Swallow Study Prior Functional Status   on soft diet at [facility]    General Date of Onset: 09/23/13 HPI: 80 yo adm to [facility] after unwitnessed fall, Pt has lengthy medical hx including Alx dx, pnas, esophageal reflux and per review of notes from facility needs assist for all ADLs.  Premorbid diet is soft.  Swallow evaluation ordered.  CXR showed left base ATX, Pt has h/o right lung infiltrate Dec 2014.  She is legally blind and hearing impaired.   Type of Study: Bedside swallow evaluation Diet Prior to this Study: NPO Temperature Spikes Noted: No Respiratory Status: Room air History of Recent Intubation: No Behavior/Cognition: Alert;Decreased sustained attention;Hard of hearing;Doesn't follow directions;Impulsive;Uncooperative;Distractible (pt has dementia) Oral Cavity - Dentition: Adequate natural dentition Self-Feeding Abilities: Total assist Patient Positioning: Upright in bed Baseline Vocal Quality: Clear Volitional Cough: Cognitively unable to elicit Volitional Swallow: Unable to elicit    Oral/Motor/Sensory Function Overall Oral Motor/Sensory Function: Appears within functional limits for tasks assessed (pt with minimal participation, able to seal lips on cup straw, speech slightly dysarthric)   Ice Chips Ice chips: Within functional limits Presentation: Spoon   Thin Liquid Thin Liquid: Within functional limits Presentation: Straw;Cup    Nectar Thick Nectar Thick Liquid: Not tested   Honey Thick Honey Thick Liquid: Not tested   Puree Puree: Impaired Presentation:  Spoon Oral Phase Impairments: Reduced lingual movement/coordination;Impaired anterior to posterior transit;Poor awareness of bolus Oral Phase Functional Implications: Prolonged oral transit Other Comments: pt talking with food in her mouth but able to be redirected with max cues   Solid   GO    Solid: Impaired Oral Phase Impairments: Reduced lingual movement/coordination;Impaired anterior to posterior transit;Impaired mastication Oral Phase Functional Implications: Other (comment) (prolonged oral transit, talking with food in mouth)       Donavan Burnet, MS [facility] [phone]

## 2013-09-23 NOTE — ED Provider Notes (Signed)
Medical screening examination/treatment/procedure(s) were performed by non-physician practitioner and as supervising physician I was immediately available for consultation/collaboration.   EKG Interpretation None        Nycholas Rayner, MD 09/23/13 0836 

## 2013-09-23 NOTE — H&P (Signed)
[facility] History and Physical  Patient: [name]  WRU:[id number]  DOB: 12-07-1915  DOS: the patient was seen and examined on 09/23/2013 PCP: Kimber Relic, MD  Chief Complaint: Fall  HPI: [name] is a 80 y.o. female with Past medical history of Alzheimer's dementia, diabetes, recurrent fall, dysphagia, recurrent pneumonia. Patient was brought in from the nursing home with a mechanical fall. The fall was unwitnessed and the patient is unable to provide any history. As per the documentation the patient was found on the nursing facility on the floor on regular checkup. She was found to have injury on the right 4 had. She was awake and at her baseline and did not have any loss of consciousness as per the staff. She was not found to have any fever nausea vomiting diarrhea recently no recent change in her medication.  The patient is coming from ALF. And at her baseline dependent for most of her ADL.  Review of Systems: as mentioned in the history of present illness.  A Comprehensive review of the other systems is negative.  Past Medical History  Diagnosis Date  . Encounter for long-term (current) use of other medications 09/06/2011  . Closed fracture of other bone of wrist 03/15/2011  . Retention of urine, unspecified 02/24/2011  . Dysphagia, oropharyngeal phase 04/09/2010  . Pneumonitis due to inhalation of food or vomitus 04/06/2010  . Abnormality of gait 08/24/2009  . Iron deficiency anemia, unspecified 07/29/2009  . Personal history of fall 07/29/2009  . Pain in joint, lower leg 06/17/2009  . Type II or unspecified type diabetes mellitus without mention of complication, not stated as uncontrolled 08/18/2008  . Rickets, active 02/08/2008  . Allergic rhinitis due to pollen 07/18/2007  . Lichenification and lichen simplex chronicus 07/18/2007  . Unspecified urinary incontinence 07/18/2007  . Leukoplakia of oral mucosa, including tongue 01/01/2007  .  Major depressive disorder, recurrent episode, severe, without mention of psychotic behavior 12/18/2006  . Alzheimer's disease 04/03/2006  . Diverticulosis of colon (without mention of hemorrhage) 02/20/2006  . Unspecified constipation 01/10/2006  . Heartburn 01/10/2006  . Unspecified disease of pericardium 01/01/2206  . Type II or unspecified type diabetes mellitus without mention of complication, uncontrolled 06/13/2005  . Edema 12/20/2004  . Postherpetic polyneuropathy 10/20/2004  . Lumbago 08/18/2004  . Muscle weakness (generalized) 01/16/2004  . Dizziness and giddiness 09/24/2003  . Insomnia, unspecified 09/24/2003  . Pain in limb 09/03/2003  . Pure hypercholesterolemia 07/16/2003  . Anxiety state, unspecified 07/16/2003  . Macular degeneration (senile) of retina, unspecified 07/16/2003  . Coronary atherosclerosis of unspecified type of vessel, native or graft 07/16/2003  . Acute, but ill-defined, cerebrovascular disease 07/16/2003  . Chronic airway obstruction, not elsewhere classified 07/16/2003  . Esophageal reflux 07/16/2003  . Osteoarthrosis, unspecified whether generalized or localized, unspecified site 07/16/2003  . Displacement of cervical intervertebral disc without myelopathy 07/16/2003  . Osteoporosis, unspecified 07/16/2003  . Unspecified hearing loss 03/11/2003  . Unspecified essential hypertension 07/03/2002  . Other specified disease of sebaceous glands 04/15/2002   Past Surgical History  Procedure Laterality Date  . Appendectomy    . Tonsillectomy    . Carpal tunnel release    . Eye surgery Left     cataracts  . Axillary artery - femoral artery bypass graft Right 2005  . Colonoscopy  02/06/2004    nl exam. Suspect tear in diverticula   Social History:  reports that she quit smoking about 12 years ago. She does not have any  smokeless tobacco history on file. Her alcohol and drug histories are not on file.  Allergies  Allergen Reactions  . Bupropion   .  Ciprocinonide [Fluocinolone]   . Codeine   . Halcion [Triazolam]   . Nortriptyline   . Phosphate   . Septra [Sulfamethoxazole-Trimethoprim]   . Sulfa Antibiotics   . Ultram [Tramadol]     Family History  Problem Relation Age of Onset  . Stroke Mother   . Heart disease Father     Prior to Admission medications   Medication Sig Start Date End Date Taking? Authorizing Provider  amLODipine (NORVASC) 10 MG tablet Take 10 mg by mouth daily. Take 1 tablet daily to treat high blood pressure.   Yes Historical Provider, MD  aspirin EC 81 MG tablet Take 1 tablet (81 mg total) by mouth daily. 11/19/12  Yes Claudette Royston Sinner, NP  diazepam (VALIUM) 5 MG tablet Take 2.5 mg by mouth every 6 (six) hours as needed for anxiety.   Yes Historical Provider, MD  Memantine HCl ER (NAMENDA XR) 14 MG CP24 Take 1 capsule by mouth daily.   Yes Historical Provider, MD  mirtazapine (REMERON) 15 MG tablet Take 15 mg by mouth 2 (two) times daily. Take one at 9 am and 4 pm to help with depression.   Yes Historical Provider, MD  Multiple Vitamins-Minerals (CENTRUM SILVER ADULT 50+ PO) Take by mouth daily.   Yes Historical Provider, MD  oxybutynin (DITROPAN-XL) 5 MG 24 hr tablet Take 5 mg by mouth at bedtime.   Yes Historical Provider, MD  PARoxetine (PAXIL) 40 MG tablet Take 40 mg by mouth every morning. Give 1 daily to treat depression/anxiety.   Yes Historical Provider, MD  rivastigmine (EXELON) 9.5 mg/24hr Place 1 patch (9.5 mg total) onto the skin daily. 12/11/12  Yes Claudette Royston Sinner, NP  senna (SENOKOT) 8.6 MG tablet Take 1 tablet by mouth daily. Take 1 every night for laxative - hold for loose stool.   Yes Historical Provider, MD  sodium chloride (OCEAN) 0.65 % nasal spray Place 1 spray into the nose. Give one spray nasal twice a day   Yes Historical Provider, MD  acetaminophen (TYLENOL) 325 MG tablet Take 650 mg by mouth. Take two tablets twice  daily as needed for pain.    Historical Provider, MD  Calcium  Carbonate-Vit D-Min (CALTRATE 600+D PLUS PO) Take by mouth daily. Take one tablet twice daily for calcium.    Historical Provider, MD  chlorpheniramine (CHLOR-TRIMETON) 4 MG tablet Take 4 mg by mouth 2 (two) times daily as needed for allergies. Take 1 tablet every 8 hours as needed    Historical Provider, MD  diazepam (VALIUM) 5 MG tablet Take 1/2 tablet by mouth once daily. 08/14/12   Claudie Revering, NP  enalapril (VASOTEC) 10 MG tablet Take 10 mg by mouth daily. Take one daily at 2 pm to control BP.    Historical Provider, MD  lactose free nutrition (BOOST PLUS) LIQD Take 237 mLs by mouth. One serving once a day    Historical Provider, MD  polyvinyl alcohol (LIQUIFILM TEARS) 1.4 % ophthalmic solution 1 drop. One drop both eyes twice daily as needed for itchy eyes, every 12 hours as needed    Historical Provider, MD    Physical Exam: Filed Vitals:   09/23/13 0345 09/23/13 0422 09/23/13 0619 09/23/13 0620  BP:  115/92 157/53   Pulse: 62 61 68   Temp:  97.7 F (36.5 C) 98 F (36.7 C)  TempSrc:  Oral Oral   Resp: 18 17    Height:    5' (1.524 m)  Weight:    54.6 kg (120 lb 5.9 oz)  SpO2: 100% 100% 96%     General: Alert, Awake and Oriented to Time, Place and Person. Appear in mild distress, laceration repaired on the for head Eyes: PERRL ENT: Oral Mucosa clear moist. Neck: mp JVD Cardiovascular: S1 and S2 Present, aortic systolic Murmur, Peripheral Pulses Present Respiratory: Bilateral Air entry equal and Decreased, faint basal Crackles, no wheezes Abdomen: Bowel Sound Present, Soft and Non tender Skin: No Rash Extremities: Trace Pedal edema, no calf tenderness Neurologic: Grossly no focal neuro deficit, difficult to assess due to being hard of hearing but does not appear to have any focal deficit.  Labs on Admission:  CBC:  Recent Labs Lab 09/23/13 0042  WBC 15.9*  NEUTROABS 13.2*  HGB 13.2  HCT 40.6  MCV 94.0  PLT 457*    CMP     Component Value Date/Time   NA 136*  09/23/2013 0042   NA 139 05/09/2013   K 4.3 09/23/2013 0042   CL 96 09/23/2013 0042   CO2 23 09/23/2013 0042   GLUCOSE 150* 09/23/2013 0042   BUN 21 09/23/2013 0042   BUN 14 05/09/2013   CREATININE 0.85 09/23/2013 0042   CREATININE 0.8 05/09/2013   CALCIUM 9.5 09/23/2013 0042   PROT 7.3 09/23/2013 0042   ALBUMIN 3.6 09/23/2013 0042   AST 22 09/23/2013 0042   ALT 15 09/23/2013 0042   ALKPHOS 127* 09/23/2013 0042   BILITOT <0.2* 09/23/2013 0042   GFRNONAA 56* 09/23/2013 0042   GFRAA 65* 09/23/2013 0042    No results found for this basename: LIPASE, AMYLASE,  in the last 168 hours No results found for this basename: AMMONIA,  in the last 168 hours  No results found for this basename: CKTOTAL, CKMB, CKMBINDEX, TROPONINI,  in the last 168 hours BNP (last 3 results) No results found for this basename: PROBNP,  in the last 8760 hours  Radiological Exams on Admission: Ct Head Wo Contrast  09/23/2013   CLINICAL DATA:  Larey Seat tonight with laceration to the right forehead. No loss of consciousness.  EXAM: CT HEAD WITHOUT CONTRAST  CT CERVICAL SPINE WITHOUT CONTRAST  TECHNIQUE: Multidetector CT imaging of the head and cervical spine was performed following the standard protocol without intravenous contrast. Multiplanar CT image reconstructions of the cervical spine were also generated.  COMPARISON:  None.  FINDINGS: CT HEAD FINDINGS  Diffuse cerebral atrophy. Ventricular dilatation most likely due to central atrophy. Extensive with low-attenuation changes throughout the deep white matter consistent with small vessel ischemic changes. Areas of encephalomalacia extending to the cortical surfaces in both frontal and parietal regions as well as in the cerebellum consistent with old infarcts. Extensive vascular calcifications. No mass effect or midline shift. No abnormal extra-axial fluid collections. Gray-white matter junctions are distinct. Basal cisterns are not effaced. No evidence of acute intracranial hemorrhage. No  depressed skull fractures. Mucosal thickening or retention cysts in the frontal sinuses. No acute air-fluid levels. Subcutaneous scalp laceration over the right anterior frontal region.  CT CERVICAL SPINE FINDINGS  Diffuse bone demineralization. Straightening of the usual cervical lordosis which may be due to patient positioning or degenerative changes. However, ligamentous injury or muscle spasm could also have this appearance in are not excluded. Slight anterior subluxation of C3 on C4 is likely to be degenerative. Prominent degenerative changes throughout the facet joints with coalition  of C2 through C4 posterior elements. Prominent hypertrophic changes demonstrated at C5-6 and C6-7 levels with prominent disc osteophyte complexes at C5-6. No vertebral compression deformities. No prevertebral soft tissue swelling. C1-2 articulation appears intact with prominent degenerative narrowing present. Vascular calcifications in the cervical carotid and vertebral arteries. Emphysematous changes in the lung apices. Calcified granuloma in the left upper lung.  IMPRESSION: No acute intracranial abnormalities. 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  Diffuse degenerative change throughout the cervical spine. Nonspecific straightening of the usual cervical lordosis. No displaced fractures identified.   Electronically Signed   By: Burman Nieves M.D.   On: 09/23/2013 01:31   Ct Cervical Spine Wo Contrast  09/23/2013   CLINICAL DATA:  Larey Seat tonight with laceration to the right forehead. No loss of consciousness.  EXAM: CT HEAD WITHOUT CONTRAST  CT CERVICAL SPINE WITHOUT CONTRAST  TECHNIQUE: Multidetector CT imaging of the head and cervical spine was performed following the standard protocol without intravenous contrast. Multiplanar CT image reconstructions of the cervical spine were also generated.  COMPARISON:  None.  FINDINGS: CT HEAD FINDINGS  Diffuse cerebral atrophy.  Ventricular dilatation most likely due to central atrophy. Extensive with low-attenuation changes throughout the deep white matter consistent with small vessel ischemic changes. Areas of encephalomalacia extending to the cortical surfaces in both frontal and parietal regions as well as in the cerebellum consistent with old infarcts. Extensive vascular calcifications. No mass effect or midline shift. No abnormal extra-axial fluid collections. Gray-white matter junctions are distinct. Basal cisterns are not effaced. No evidence of acute intracranial hemorrhage. No depressed skull fractures. Mucosal thickening or retention cysts in the frontal sinuses. No acute air-fluid levels. Subcutaneous scalp laceration over the right anterior frontal region.  CT CERVICAL SPINE FINDINGS  Diffuse bone demineralization. Straightening of the usual cervical lordosis which may be due to patient positioning or degenerative changes. However, ligamentous injury or muscle spasm could also have this appearance in are not excluded. Slight anterior subluxation of C3 on C4 is likely to be degenerative. Prominent degenerative changes throughout the facet joints with coalition of C2 through C4 posterior elements. Prominent hypertrophic changes demonstrated at C5-6 and C6-7 levels with prominent disc osteophyte complexes at C5-6. No vertebral compression deformities. No prevertebral soft tissue swelling. C1-2 articulation appears intact with prominent degenerative narrowing present. Vascular calcifications in the cervical carotid and vertebral arteries. Emphysematous changes in the lung apices. Calcified granuloma in the left upper lung.  IMPRESSION: No acute intracranial abnormalities. 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  Diffuse degenerative change throughout the cervical spine. Nonspecific straightening of the usual cervical lordosis. No displaced fractures identified.   Electronically  Signed   By: Burman Nieves M.D.   On: 09/23/2013 01:31   Dg Chest Port 1 View  09/23/2013   CLINICAL DATA:  Fall.  EXAM: PORTABLE CHEST - 1 VIEW  COMPARISON:  Chest radiograph April 06, 2010  FINDINGS: The cardiac silhouette appears mildly enlarged, tortuous calcified aortic. Mild chronic interstitial changes, strandy densities in left lung base. No pleural effusions. No pneumothorax. Surgical clips project in right lung apex. Mild right shoulder intra-articular calcifications could reflect calcific tendinopathy. Patient is osteopenic. Multiple EKG lines overlie the patient and may obscure subtle underlying pathology.  IMPRESSION: Mild cardiomegaly and chronic interstitial changes strandy density in the left lung base likely reflecting atelectasis.   Electronically Signed   By: Awilda Metro   On: 09/23/2013 01:30  EKG: Independently reviewed. normal sinus rhythm, nonspecific ST and T waves changes. Assessment/Plan Principal Problem:   HCAP (healthcare-associated pneumonia) Active Problems:   Type II or unspecified type diabetes mellitus without mention of complication, uncontrolled   Dysphagia, oropharyngeal phase   Major depressive disorder, recurrent episode, severe, without mention of psychotic behavior   Alzheimer's disease   Unspecified hearing loss   Unspecified essential hypertension   1. HCAP (healthcare-associated pneumonia) Patient is presenting with complaints of a mechanical fall. On further she was found to have possible linear atelectasis reason on her left on chest x-ray which appears new as compared to prior chest x-ray. She was found to be hypoxic with saturations in 84 on room air. With this she also has leukocytosis. With this the patient is currently admitted in the hospital for possible healthcare associated pneumonia. She will be treated with vancomycin and Levaquin for broad-spectrum coverage. Follow blood culture, urine antigens, sputum culture.  2.  Dementia Depression Patient has mild agitation at present currently we will continue her on her home medication. Monitor for agitation fall precaution and aspiration precaution. Speech therapy evaluation for aspiration.  3. Diabetes mellitus. Placing her on sliding scale.  4. Hypertension. Continue home medication.  DVT Prophylaxis: subcutaneous Heparin Nutrition: N.p.o.  Code Status: Full as per documentation  Disposition: Admitted to inpatient in telemetry unit.  Author: Lynden OxfordPranav Donelda Mailhot, MD [facility] Pager: [phone] 09/23/2013, 6:36 AM    If 7PM-7AM, please contact night-coverage [email or web address] Password [email or web address]  **Disclaimer: This note may have been dictated with voice recognition software. Similar sounding words can inadvertently be transcribed and this note may contain transcription errors which may not have been corrected upon publication of note.**

## 2013-09-23 NOTE — Progress Notes (Signed)
09/23/13-got pt up from ER dept at 06:10am, was quiet & calm until after we moved pt from stretcher to bed, then repeatative speech, somewhat unable to understand her at times, very restless in bed, & keeps pulling off covers & trying to get out of bed, Dr Jomarie LongsJoseph notified that pt was given Valium & Remeron in ER dept before transfer, but she was out of control at the time, & the pt was trying to pull me into the bed with her by latching onto my hand & arm as I was speaking to Dr Jomarie LongsJoseph on the phone. No family present, as the ER nurse had reported they went home, cause they had been here most of nite. Restraints & Haldol ordered. Tried the safety mittens first, but waist belt had to be applied, & then the wrist restraints as well. The Haldol 2mg  was given via IVP via NSL. A complete history was not atainable at this time, because the pt can't answer any questions. A physical exam was also quiet dificult as the pt kept latching onto staff, & holding on for dear life like clinging. Reported to day shift RN to attempt to complete the hx in computer when the family visits later, hopefully today. Pt con't to use repeatative chants, but calmer after Haldol has started to take affect.

## 2013-09-23 NOTE — ED Notes (Signed)
Pt's Son and PONena Jordan: Ed Polio: cell [phone] Pt's daughter:  Wallace GoingKay Ivey: cell: [phone]

## 2013-09-23 NOTE — Progress Notes (Signed)
ANTIBIOTIC CONSULT NOTE - INITIAL  Pharmacy Consult for Vancomycin, Levaquin [pharmacy to adjust antibiotic dosing for renal function] Indication: Pneumonia  Allergies  Allergen Reactions  . Bupropion   . Ciprocinonide [Fluocinolone]   . Codeine   . Halcion [Triazolam]   . Nortriptyline   . Phosphate   . Septra [Sulfamethoxazole-Trimethoprim]   . Sulfa Antibiotics   . Ultram [Tramadol]     Patient Measurements: Height: 5' (152.4 cm) Weight: 120 lb 5.9 oz (54.6 kg) IBW/kg (Calculated) : 45.5   Vital Signs: Temp: 98 F (36.7 C) (07/27 91470619) Temp src: Oral (07/27 0619) BP: 157/53 mmHg (07/27 0619) Pulse Rate: 68 (07/27 0619) Intake/Output from previous day:   Intake/Output from this shift:    Labs:  Recent Labs  09/23/13 0042  WBC 15.9*  HGB 13.2  PLT 457*  CREATININE 0.85   Estimated Creatinine Clearance: 30 ml/min (by C-G formula based on Cr of 0.85). No results found for this basename: VANCOTROUGH, VANCOPEAK, VANCORANDOM, GENTTROUGH, GENTPEAK, GENTRANDOM, TOBRATROUGH, TOBRAPEAK, TOBRARND, AMIKACINPEAK, AMIKACINTROU, AMIKACIN,  in the last 72 hours   Microbiology: No results found for this or any previous visit (from the past 720 hour(s)).  Medical History: Past Medical History  Diagnosis Date  . Encounter for long-term (current) use of other medications 09/06/2011  . Closed fracture of other bone of wrist 03/15/2011  . Retention of urine, unspecified 02/24/2011  . Dysphagia, oropharyngeal phase 04/09/2010  . Pneumonitis due to inhalation of food or vomitus 04/06/2010  . Abnormality of gait 08/24/2009  . Iron deficiency anemia, unspecified 07/29/2009  . Personal history of fall 07/29/2009  . Pain in joint, lower leg 06/17/2009  . Type II or unspecified type diabetes mellitus without mention of complication, not stated as uncontrolled 08/18/2008  . Rickets, active 02/08/2008  . Allergic rhinitis due to pollen 07/18/2007  . Lichenification and lichen  simplex chronicus 07/18/2007  . Unspecified urinary incontinence 07/18/2007  . Leukoplakia of oral mucosa, including tongue 01/01/2007  . Major depressive disorder, recurrent episode, severe, without mention of psychotic behavior 12/18/2006  . Alzheimer's disease 04/03/2006  . Diverticulosis of colon (without mention of hemorrhage) 02/20/2006  . Unspecified constipation 01/10/2006  . Heartburn 01/10/2006  . Unspecified disease of pericardium 01/01/2206  . Type II or unspecified type diabetes mellitus without mention of complication, uncontrolled 06/13/2005  . Edema 12/20/2004  . Postherpetic polyneuropathy 10/20/2004  . Lumbago 08/18/2004  . Muscle weakness (generalized) 01/16/2004  . Dizziness and giddiness 09/24/2003  . Insomnia, unspecified 09/24/2003  . Pain in limb 09/03/2003  . Pure hypercholesterolemia 07/16/2003  . Anxiety state, unspecified 07/16/2003  . Macular degeneration (senile) of retina, unspecified 07/16/2003  . Coronary atherosclerosis of unspecified type of vessel, native or graft 07/16/2003  . Acute, but ill-defined, cerebrovascular disease 07/16/2003  . Chronic airway obstruction, not elsewhere classified 07/16/2003  . Esophageal reflux 07/16/2003  . Osteoarthrosis, unspecified whether generalized or localized, unspecified site 07/16/2003  . Displacement of cervical intervertebral disc without myelopathy 07/16/2003  . Osteoporosis, unspecified 07/16/2003  . Unspecified hearing loss 03/11/2003  . Unspecified essential hypertension 07/03/2002  . Other specified disease of sebaceous glands 04/15/2002    Medications:  Scheduled:  . amLODipine  10 mg Oral Daily  . aspirin EC  81 mg Oral Daily  . enalapril  10 mg Oral Daily  . heparin  5,000 Units Subcutaneous 3 times per day  . levofloxacin (LEVAQUIN) IV  250 mg Intravenous Once  . [START ON 09/25/2013] levofloxacin (LEVAQUIN) IV  750 mg Intravenous  Q48H  . Memantine HCl ER  14 mg Oral Daily  . mirtazapine  15  mg Oral BID  . oxybutynin  5 mg Oral QHS  . PARoxetine  40 mg Oral Daily  . rivastigmine  9.5 mg Transdermal Daily  . vancomycin  1,000 mg Intravenous Once  . [START ON 09/24/2013] vancomycin  750 mg Intravenous Q24H   Infusions:  . sodium chloride     PRN: diazepam  Assessment: 80 y/o F resident of [facility] was brought to ED after an unwitnessed fall and was found to have hypoxia, leukocytosis, and abnormal CXR with changes that are nonspecific but new.  Orders received to begin vancomycin with pharmacy dosing assistance and levofloxacin 500 mg IV x 1 with pharmacy to further adjust for renal function.  Goal of Therapy:  Appropriate antibiotic dosing for renal function; eradication of infection. Vancomycin trough 15-20  Plan:  1. Levaquin 250 mg IV additional dose this AM (to make total dose of 750 mg today), then 750 mg IV q48h (adjusted for estimated CrCl stratum 20 - 49 mL/min). 2. Vancomycin 1000 mg IV x 1, then 750 mg IV q24h. 3. Follow serum creatinine, cultures, clinical course. 4. Check vancomycin trough at steady-state unless de-escalation occurs in the interim.  Elie Goody, PharmD, BCPS Pager: [phone] 09/23/2013  6:54 AM

## 2013-09-23 NOTE — ED Provider Notes (Signed)
LACERATION REPAIR Performed by: Antony MaduraHUMES, Shivani Barrantes Authorized by: Dr. Loren Raceravid Yelverton Consent: Verbal consent obtained. Risks and benefits: risks, benefits and alternatives were discussed Consent given by: patient Patient identity confirmed: provided demographic data Prepped and Draped in normal sterile fashion Wound explored  Laceration Location: R forehead  Laceration Length: 7cm  No Foreign Bodies seen or palpated  Anesthesia: local infiltration  Local anesthetic: lidocaine 1% with epinephrine  Anesthetic total: 6 ml  Irrigation method: syringe Amount of cleaning: standard  Skin closure: 5-0 prolene  Number of sutures: 6  Technique: simple interrupted  Patient tolerance: Patient tolerated the procedure well with no immediate complications.   Antony MaduraKelly Gerene Nedd, PA-C 09/23/13 [phone]

## 2013-09-23 NOTE — Progress Notes (Signed)
[facility] PROGRESS NOTE  [name] ZOX:[id number] DOB: [birth year] DOA: 09/22/2013 PCP: Kimber Relic, MD  Assessment/Plan: 80 y.o. female with Past medical history of Alzheimer's dementia, diabetes, recurrent fall, dysphagia, recurrent pneumonia, chronic respiratory failure on oxygen, h/o CVAs brought in from the nursing home with a mechanical fall found to have pneumonia   1. HCAP/hypoxia/leukocytosis;  -cont IV atx, bronchodilators, oxygen; close monitor   2. Acute on chronic respiratory failure due to HCAP; improving, cont as above  3. Advanced dementia with complications, dysphagia, falls-recurrent, infections, recurrent pneumonias -cont supportive care, prognosis guarded, fall precautions, aspiration precautions   4. + POC trop; no chest pain; ECG old MI; Patient denies chest pain; repeat serial trop, ECG, monitor  5. DM, borderline; cont ISS as needed  D/w patient, her daughter recommended palliative care with advanced dementia with ongoing complications;  -But she said that her brother does not want palliative care or discussion about GOC;  -need to continue ongoing Pt, family discussions   Code Status: full Family Communication:  D/w patient, her daughter  (indicate person spoken with, relationship, and if by phone, the number) Disposition Plan: SNF in 24-48 hrs pend clinical improvement    Consultants:  none  Procedures:  none  Antibiotics:  Levofloxacin 7/27<<<  vanc 7/28<><<   (indicate start date, and stop date if known)  HPI/Subjective: Alert, confused at baseline   Objective: Filed Vitals:   09/23/13 1137  BP: 133/67  Pulse: 80  Temp:   Resp:     Intake/Output Summary (Last 24 hours) at 09/23/13 1309 Last data filed at 09/23/13 1148  Gross per 24 hour  Intake     50 ml  Output    825 ml  Net   -775 ml   Filed Weights   09/23/13 0620  Weight: 54.6 kg (120 lb 5.9 oz)    Exam:   General:  alert  Cardiovascular: s1,s2  rrr  Respiratory: CTA BL  Abdomen: soft, nt,nd   Musculoskeletal: no LE edema   Data Reviewed: Basic Metabolic Panel:  Recent Labs Lab 09/23/13 0042  NA 136*  K 4.3  CL 96  CO2 23  GLUCOSE 150*  BUN 21  CREATININE 0.85  CALCIUM 9.5   Liver Function Tests:  Recent Labs Lab 09/23/13 0042  AST 22  ALT 15  ALKPHOS 127*  BILITOT <0.2*  PROT 7.3  ALBUMIN 3.6   No results found for this basename: LIPASE, AMYLASE,  in the last 168 hours No results found for this basename: AMMONIA,  in the last 168 hours CBC:  Recent Labs Lab 09/23/13 0042  WBC 15.9*  NEUTROABS 13.2*  HGB 13.2  HCT 40.6  MCV 94.0  PLT 457*   Cardiac Enzymes: No results found for this basename: CKTOTAL, CKMB, CKMBINDEX, TROPONINI,  in the last 168 hours BNP (last 3 results) No results found for this basename: PROBNP,  in the last 8760 hours CBG: No results found for this basename: GLUCAP,  in the last 168 hours  Recent Results (from the past 240 hour(s))  MRSA PCR SCREENING     Status: None   Collection Time    09/23/13  6:20 AM      Result Value Ref Range Status   MRSA by PCR NEGATIVE  NEGATIVE Final   Comment:            The GeneXpert MRSA Assay (FDA     approved for NASAL specimens     only), is one component  of a     comprehensive MRSA colonization     surveillance program. It is not     intended to diagnose MRSA     infection nor to guide or     monitor treatment for     MRSA infections.     Studies: Ct Head Wo Contrast  09/23/2013   CLINICAL DATA:  Larey Seat tonight with laceration to the right forehead. No loss of consciousness.  EXAM: CT HEAD WITHOUT CONTRAST  CT CERVICAL SPINE WITHOUT CONTRAST  TECHNIQUE: Multidetector CT imaging of the head and cervical spine was performed following the standard protocol without intravenous contrast. Multiplanar CT image reconstructions of the cervical spine were also generated.  COMPARISON:  None.  FINDINGS: CT HEAD FINDINGS  Diffuse cerebral  atrophy. Ventricular dilatation most likely due to central atrophy. Extensive with low-attenuation changes throughout the deep white matter consistent with small vessel ischemic changes. Areas of encephalomalacia extending to the cortical surfaces in both frontal and parietal regions as well as in the cerebellum consistent with old infarcts. Extensive vascular calcifications. No mass effect or midline shift. No abnormal extra-axial fluid collections. Gray-white matter junctions are distinct. Basal cisterns are not effaced. No evidence of acute intracranial hemorrhage. No depressed skull fractures. Mucosal thickening or retention cysts in the frontal sinuses. No acute air-fluid levels. Subcutaneous scalp laceration over the right anterior frontal region.  CT CERVICAL SPINE FINDINGS  Diffuse bone demineralization. Straightening of the usual cervical lordosis which may be due to patient positioning or degenerative changes. However, ligamentous injury or muscle spasm could also have this appearance in are not excluded. Slight anterior subluxation of C3 on C4 is likely to be degenerative. Prominent degenerative changes throughout the facet joints with coalition of C2 through C4 posterior elements. Prominent hypertrophic changes demonstrated at C5-6 and C6-7 levels with prominent disc osteophyte complexes at C5-6. No vertebral compression deformities. No prevertebral soft tissue swelling. C1-2 articulation appears intact with prominent degenerative narrowing present. Vascular calcifications in the cervical carotid and vertebral arteries. Emphysematous changes in the lung apices. Calcified granuloma in the left upper lung.  IMPRESSION: No acute intracranial abnormalities. 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  Diffuse degenerative change throughout the cervical spine. Nonspecific straightening of the usual cervical lordosis. No displaced fractures identified.    Electronically Signed   By: Burman Nieves M.D.   On: 09/23/2013 01:31   Ct Cervical Spine Wo Contrast  09/23/2013   CLINICAL DATA:  Larey Seat tonight with laceration to the right forehead. No loss of consciousness.  EXAM: CT HEAD WITHOUT CONTRAST  CT CERVICAL SPINE WITHOUT CONTRAST  TECHNIQUE: Multidetector CT imaging of the head and cervical spine was performed following the standard protocol without intravenous contrast. Multiplanar CT image reconstructions of the cervical spine were also generated.  COMPARISON:  None.  FINDINGS: CT HEAD FINDINGS  Diffuse cerebral atrophy. Ventricular dilatation most likely due to central atrophy. Extensive with low-attenuation changes throughout the deep white matter consistent with small vessel ischemic changes. Areas of encephalomalacia extending to the cortical surfaces in both frontal and parietal regions as well as in the cerebellum consistent with old infarcts. Extensive vascular calcifications. No mass effect or midline shift. No abnormal extra-axial fluid collections. Gray-white matter junctions are distinct. Basal cisterns are not effaced. No evidence of acute intracranial hemorrhage. No depressed skull fractures. Mucosal thickening or retention cysts in the frontal sinuses. No acute air-fluid levels. Subcutaneous scalp laceration over the right anterior frontal region.  CT  CERVICAL SPINE FINDINGS  Diffuse bone demineralization. Straightening of the usual cervical lordosis which may be due to patient positioning or degenerative changes. However, ligamentous injury or muscle spasm could also have this appearance in are not excluded. Slight anterior subluxation of C3 on C4 is likely to be degenerative. Prominent degenerative changes throughout the facet joints with coalition of C2 through C4 posterior elements. Prominent hypertrophic changes demonstrated at C5-6 and C6-7 levels with prominent disc osteophyte complexes at C5-6. No vertebral compression deformities. No  prevertebral soft tissue swelling. C1-2 articulation appears intact with prominent degenerative narrowing present. Vascular calcifications in the cervical carotid and vertebral arteries. Emphysematous changes in the lung apices. Calcified granuloma in the left upper lung.  IMPRESSION: No acute intracranial abnormalities. 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  Diffuse degenerative change throughout the cervical spine. Nonspecific straightening of the usual cervical lordosis. No displaced fractures identified.   Electronically Signed   By: Burman NievesWilliam  Stevens M.D.   On: 09/23/2013 01:31   Dg Chest Port 1 View  09/23/2013   CLINICAL DATA:  Fall.  EXAM: PORTABLE CHEST - 1 VIEW  COMPARISON:  Chest radiograph April 06, 2010  FINDINGS: The cardiac silhouette appears mildly enlarged, tortuous calcified aortic. Mild chronic interstitial changes, strandy densities in left lung base. No pleural effusions. No pneumothorax. Surgical clips project in right lung apex. Mild right shoulder intra-articular calcifications could reflect calcific tendinopathy. Patient is osteopenic. Multiple EKG lines overlie the patient and may obscure subtle underlying pathology.  IMPRESSION: Mild cardiomegaly and chronic interstitial changes strandy density in the left lung base likely reflecting atelectasis.   Electronically Signed   By: Awilda Metroourtnay  Bloomer   On: 09/23/2013 01:30    Scheduled Meds: . amLODipine  10 mg Oral Daily  . aspirin EC  81 mg Oral Daily  . enalapril  10 mg Oral Daily  . heparin  5,000 Units Subcutaneous 3 times per day  . [START ON 09/25/2013] levofloxacin (LEVAQUIN) IV  750 mg Intravenous Q48H  . Memantine HCl ER  14 mg Oral Daily  . mirtazapine  15 mg Oral BID  . oxybutynin  5 mg Oral QHS  . PARoxetine  40 mg Oral Daily  . rivastigmine  9.5 mg Transdermal Daily  . vancomycin  1,000 mg Intravenous Once  . [START ON 09/24/2013] vancomycin  750 mg Intravenous  Q24H   Continuous Infusions: . sodium chloride      Principal Problem:   HCAP (healthcare-associated pneumonia) Active Problems:   Type II or unspecified type diabetes mellitus without mention of complication, uncontrolled   Dysphagia, oropharyngeal phase   Major depressive disorder, recurrent episode, severe, without mention of psychotic behavior   Alzheimer's disease   Unspecified hearing loss   Unspecified essential hypertension    Time spent: >35 minutes     Esperanza SheetsBURIEV, Ben Habermann N  [facility] Pager [phone]. If 7PM-7AM, please contact night-coverage at [email or web address], password [email or web address] 09/23/2013, 1:09 PM  LOS: 1 day

## 2013-09-24 LAB — CBC
HCT: 43 % (ref 36.0–46.0)
Hemoglobin: 14.2 g/dL (ref 12.0–15.0)
MCH: 31.4 pg (ref 26.0–34.0)
MCHC: 33 g/dL (ref 30.0–36.0)
MCV: 95.1 fL (ref 78.0–100.0)
PLATELETS: 426 10*3/uL — AB (ref 150–400)
RBC: 4.52 MIL/uL (ref 3.87–5.11)
RDW: 14.2 % (ref 11.5–15.5)
WBC: 12.1 10*3/uL — ABNORMAL HIGH (ref 4.0–10.5)

## 2013-09-24 LAB — LEGIONELLA ANTIGEN, URINE: Legionella Antigen, Urine: NEGATIVE

## 2013-09-24 LAB — TROPONIN I: Troponin I: <0.3 ng/mL (ref <0.30)

## 2013-09-24 NOTE — Progress Notes (Signed)
SLP Cancellation Note  Patient Details Name: [name] MRN: [id number] DOB: 01/01/1916   Cancelled treatment:       Reason Eval/Treat Not Completed: Other (comment) (Daughter [name] present and stated "We don't need that" when informed SlP follow up after initial BSE yesterday to assure diet tolerance.  Daughter stated pt has been on a "soft diet for years".  Pt being fed by daughter with HOB marginally lowered (approximately 30 degrees and SlP advised daughter [name] to [facility] for pt safety. [name] replied that the pt was still on her "Xanax drip" and mimicked leaning to left.  Will sign off, please reorder if indicated.    Mickie BailKimball, Vannia Pola Ann Cynde Menard MontelloKimball, TennesseeMS [facility] SLP [phone]

## 2013-09-24 NOTE — Progress Notes (Signed)
[facility] PROGRESS NOTE  [name] ZOX:[id number] DOB: May 04, 1915 DOA: 09/22/2013 PCP: Kimber Relic, MD  Assessment/Plan: 80 y.o. female with Past medical history of Alzheimer's dementia, diabetes, recurrent fall, dysphagia, recurrent pneumonia, chronic respiratory failure on oxygen, h/o CVAs brought in from the nursing home with a mechanical fall found to have pneumonia   1. HCAP/hypoxia/leukocytosis;  -afebrile; leukocytosis improving on IV atx, bronchodilators, oxygen; close monitor   2. Acute on chronic respiratory failure due to HCAP; improving, cont as above  3. Advanced dementia with behavioral changes;  complications, dysphagia, falls-recurrent, infections, recurrent pneumonias -cont supportive care, prognosis guarded, fall precautions, aspiration precautions   4. + POC trop; no chest pain; ECG old MI; Patient denies chest pain; repeat serial trop negative;   5. DM, borderline; cont ISS as needed  D/w patient, her daughter recommended palliative care with advanced dementia with ongoing complications;  -But she said that her brother does not want palliative care or discussion about GOC;  -need to continue ongoing Pt, family discussions   Plan d/c SNF in AM if stable   Code Status: full Family Communication:  D/w patient, her daughter  (indicate person spoken with, relationship, and if by phone, the number) Disposition Plan: SNF in 24-48 hrs pend clinical improvement    Consultants:  none  Procedures:  none  Antibiotics:  Levofloxacin 7/27<<<  vanc 7/28<><<   (indicate start date, and stop date if known)  HPI/Subjective: Alert, confused at baseline   Objective: Filed Vitals:   09/24/13 1000  BP: 121/52  Pulse: 75  Temp: 98 F (36.7 C)  Resp: 24    Intake/Output Summary (Last 24 hours) at 09/24/13 1150 Last data filed at 09/24/13 1100  Gross per 24 hour  Intake    520 ml  Output    250 ml  Net    270 ml   Filed Weights    09/23/13 0620 09/24/13 0615  Weight: 54.6 kg (120 lb 5.9 oz) 54.4 kg (119 lb 14.9 oz)    Exam:   General:  alert  Cardiovascular: s1,s2 rrr  Respiratory: CTA BL  Abdomen: soft, nt,nd   Musculoskeletal: no LE edema   Data Reviewed: Basic Metabolic Panel:  Recent Labs Lab 09/23/13 0042  NA 136*  K 4.3  CL 96  CO2 23  GLUCOSE 150*  BUN 21  CREATININE 0.85  CALCIUM 9.5   Liver Function Tests:  Recent Labs Lab 09/23/13 0042  AST 22  ALT 15  ALKPHOS 127*  BILITOT <0.2*  PROT 7.3  ALBUMIN 3.6   No results found for this basename: LIPASE, AMYLASE,  in the last 168 hours No results found for this basename: AMMONIA,  in the last 168 hours CBC:  Recent Labs Lab 09/23/13 0042 09/24/13 0110  WBC 15.9* 12.1*  NEUTROABS 13.2*  --   HGB 13.2 14.2  HCT 40.6 43.0  MCV 94.0 95.1  PLT 457* 426*   Cardiac Enzymes:  Recent Labs Lab 09/23/13 1418 09/23/13 1855 09/24/13 0110  TROPONINI <0.30 <0.30 <0.30   BNP (last 3 results) No results found for this basename: PROBNP,  in the last 8760 hours CBG: No results found for this basename: GLUCAP,  in the last 168 hours  Recent Results (from the past 240 hour(s))  MRSA PCR SCREENING     Status: None   Collection Time    09/23/13  6:20 AM      Result Value Ref Range Status   MRSA by PCR NEGATIVE  NEGATIVE Final   Comment:            The GeneXpert MRSA Assay (FDA     approved for NASAL specimens     only), is one component of a     comprehensive MRSA colonization     surveillance program. It is not     intended to diagnose MRSA     infection nor to guide or     monitor treatment for     MRSA infections.     Studies: Ct Head Wo Contrast  09/23/2013   CLINICAL DATA:  Larey Seat tonight with laceration to the right forehead. No loss of consciousness.  EXAM: CT HEAD WITHOUT CONTRAST  CT CERVICAL SPINE WITHOUT CONTRAST  TECHNIQUE: Multidetector CT imaging of the head and cervical spine was performed following the  standard protocol without intravenous contrast. Multiplanar CT image reconstructions of the cervical spine were also generated.  COMPARISON:  None.  FINDINGS: CT HEAD FINDINGS  Diffuse cerebral atrophy. Ventricular dilatation most likely due to central atrophy. Extensive with low-attenuation changes throughout the deep white matter consistent with small vessel ischemic changes. Areas of encephalomalacia extending to the cortical surfaces in both frontal and parietal regions as well as in the cerebellum consistent with old infarcts. Extensive vascular calcifications. No mass effect or midline shift. No abnormal extra-axial fluid collections. Gray-white matter junctions are distinct. Basal cisterns are not effaced. No evidence of acute intracranial hemorrhage. No depressed skull fractures. Mucosal thickening or retention cysts in the frontal sinuses. No acute air-fluid levels. Subcutaneous scalp laceration over the right anterior frontal region.  CT CERVICAL SPINE FINDINGS  Diffuse bone demineralization. Straightening of the usual cervical lordosis which may be due to patient positioning or degenerative changes. However, ligamentous injury or muscle spasm could also have this appearance in are not excluded. Slight anterior subluxation of C3 on C4 is likely to be degenerative. Prominent degenerative changes throughout the facet joints with coalition of C2 through C4 posterior elements. Prominent hypertrophic changes demonstrated at C5-6 and C6-7 levels with prominent disc osteophyte complexes at C5-6. No vertebral compression deformities. No prevertebral soft tissue swelling. C1-2 articulation appears intact with prominent degenerative narrowing present. Vascular calcifications in the cervical carotid and vertebral arteries. Emphysematous changes in the lung apices. Calcified granuloma in the left upper lung.  IMPRESSION: No acute intracranial abnormalities. Chronic atrophy and small vessel ischemic changes with  probable old infarcts bilaterally.  Diffuse degenerative change throughout the cervical spine. Nonspecific straightening of the usual cervical lordosis. No displaced fractures identified.   Electronically Signed   By: Burman Nieves M.D.   On: 09/23/2013 01:31   Ct Cervical Spine Wo Contrast  09/23/2013   CLINICAL DATA:  Larey Seat tonight with laceration to the right forehead. No loss of consciousness.  EXAM: CT HEAD WITHOUT CONTRAST  CT CERVICAL SPINE WITHOUT CONTRAST  TECHNIQUE: Multidetector CT imaging of the head and cervical spine was performed following the standard protocol without intravenous contrast. Multiplanar CT image reconstructions of the cervical spine were also generated.  COMPARISON:  None.  FINDINGS: CT HEAD FINDINGS  Diffuse cerebral atrophy. Ventricular dilatation most likely due to central atrophy. Extensive with low-attenuation changes throughout the deep white matter consistent with small vessel ischemic changes. Areas of encephalomalacia extending to the cortical surfaces in both frontal and parietal regions as well as in the cerebellum consistent with old infarcts. Extensive vascular calcifications. No mass effect or midline shift. No abnormal extra-axial fluid collections. Gray-white matter junctions are distinct. Basal cisterns  are not effaced. No evidence of acute intracranial hemorrhage. No depressed skull fractures. Mucosal thickening or retention cysts in the frontal sinuses. No acute air-fluid levels. Subcutaneous scalp laceration over the right anterior frontal region.  CT CERVICAL SPINE FINDINGS  Diffuse bone demineralization. Straightening of the usual cervical lordosis which may be due to patient positioning or degenerative changes. However, ligamentous injury or muscle spasm could also have this appearance in are not excluded. Slight anterior subluxation of C3 on C4 is likely to be degenerative. Prominent degenerative changes throughout the facet joints with coalition of C2  through C4 posterior elements. Prominent hypertrophic changes demonstrated at C5-6 and C6-7 levels with prominent disc osteophyte complexes at C5-6. No vertebral compression deformities. No prevertebral soft tissue swelling. C1-2 articulation appears intact with prominent degenerative narrowing present. Vascular calcifications in the cervical carotid and vertebral arteries. Emphysematous changes in the lung apices. Calcified granuloma in the left upper lung.  IMPRESSION: No acute intracranial abnormalities. 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  Diffuse degenerative change throughout the cervical spine. Nonspecific straightening of the usual cervical lordosis. No displaced fractures identified.   Electronically Signed   By: Burman NievesWilliam  Stevens M.D.   On: 09/23/2013 01:31   Dg Chest Port 1 View  09/23/2013   CLINICAL DATA:  Fall.  EXAM: PORTABLE CHEST - 1 VIEW  COMPARISON:  Chest radiograph April 06, 2010  FINDINGS: The cardiac silhouette appears mildly enlarged, tortuous calcified aortic. Mild chronic interstitial changes, strandy densities in left lung base. No pleural effusions. No pneumothorax. Surgical clips project in right lung apex. Mild right shoulder intra-articular calcifications could reflect calcific tendinopathy. Patient is osteopenic. Multiple EKG lines overlie the patient and may obscure subtle underlying pathology.  IMPRESSION: Mild cardiomegaly and chronic interstitial changes strandy density in the left lung base likely reflecting atelectasis.   Electronically Signed   By: Awilda Metroourtnay  Bloomer   On: 09/23/2013 01:30    Scheduled Meds: . amLODipine  10 mg Oral Daily  . aspirin EC  81 mg Oral Daily  . enalapril  10 mg Oral Daily  . heparin  5,000 Units Subcutaneous 3 times per day  . [START ON 09/25/2013] levofloxacin (LEVAQUIN) IV  750 mg Intravenous Q48H  . Memantine HCl ER  14 mg Oral Daily  . mirtazapine  15 mg Oral BID  . oxybutynin  5  mg Oral QHS  . PARoxetine  40 mg Oral Daily  . rivastigmine  9.5 mg Transdermal Daily  . vancomycin  1,000 mg Intravenous Once  . vancomycin  750 mg Intravenous Q24H  . zolpidem  5 mg Oral QHS   Continuous Infusions: . sodium chloride      Principal Problem:   HCAP (healthcare-associated pneumonia) Active Problems:   Type II or unspecified type diabetes mellitus without mention of complication, uncontrolled   Dysphagia, oropharyngeal phase   Major depressive disorder, recurrent episode, severe, without mention of psychotic behavior   Alzheimer's disease   Unspecified hearing loss   Unspecified essential hypertension    Time spent: >35 minutes     Esperanza SheetsBURIEV, Davin Archuletta N  [facility] Pager [phone]. If 7PM-7AM, please contact night-coverage at [email or web address], password [email or web address] 09/24/2013, 11:50 AM  LOS: 2 days

## 2013-09-24 NOTE — Clinical Documentation Improvement (Signed)
Possible Conditions findings potentially associated with Behavioral disturbances?  _______________Dementia in conditions classified elsewhere without behavioral disturbance  _______________Dementia with behavioral disturbance  Other Not able to determine  Risk Factors: Fall, Alzheimer's dementia, PNA, Sign & Symptoms:  H&P:" Dementia Depression Patient has mild agitation at present currently we will continue her on her home medication" Staff notes: 09/23/13 "very restless in bed, & keeps pulling off covers & trying to get out of bed  Diagnostics: Treatment: 09/23/13: pt was given Valium & Remeron in ER dept  Restraints & Haldol 2 mg IV ordered x 1                      Valium 2.5mg  po every 6 hrs  Prn anxiety                      Remeron 15 mg  Po BID  Thank you,  Andy GaussGarnet C [name] ,RN Clinical Documentation Specialist:  [phone]  [facility]- Health Information Management

## 2013-09-25 MED ORDER — METRONIDAZOLE 500 MG PO TABS: 500.0000 mg | ORAL_TABLET | Freq: Three times a day (TID) | ORAL | Status: DC

## 2013-09-25 MED ORDER — LEVOFLOXACIN 750 MG PO TABS: 750.0000 mg | ORAL_TABLET | Freq: Every day | ORAL | Status: DC

## 2013-09-25 MED ORDER — DIAZEPAM 5 MG PO TABS: 2.5000 mg | ORAL_TABLET | Freq: Four times a day (QID) | ORAL | Status: AC | PRN

## 2013-09-25 MED ORDER — METRONIDAZOLE 500 MG PO TABS
500.0000 mg | ORAL_TABLET | Freq: Three times a day (TID) | ORAL | Status: DC
  Filled 2013-09-25 (×2): qty 1

## 2013-09-25 NOTE — Progress Notes (Signed)
[facility]. Patient & daughter at bedside aware. Discharge packet given to RN, Connye BurkittAlly. Daughter to transport back to SNF. Camille @ [facility] aware.   Lincoln MaxinKelly Oluwatosin Bracy, LCSW [facility] Clinical Social Worker cell #: [phone]

## 2013-09-25 NOTE — Progress Notes (Signed)
Clinical Social Work Department BRIEF PSYCHOSOCIAL ASSESSMENT 09/25/2013  Patient:  [name],[name]     Account Number:  [id number]     Admit date:  09/22/2013  Clinical Social Worker:  Orpah GreekFOLEY,Takari Duncombe, LCSWA  Date/Time:  09/25/2013 10:38 AM  Referred by:  Physician  Date Referred:  09/25/2013 Referred for  Other - See comment   Other Referral:   Admitted from: [facility]   Interview type:  Family Other interview type:   patient's daughter, [name] at bedside    PSYCHOSOCIAL DATA Living Status:  FACILITY [facility] Level of care:  Skilled Nursing Facility Primary support name:  [name] Ivey (dtr) Wright#: [phone] c#: [phone] Primary support relationship to patient:  CHILD, ADULT Degree of support available:   good    CURRENT CONCERNS Current Concerns  Post-Acute Placement   Other Concerns:    SOCIAL WORK ASSESSMENT / PLAN CSW received consult that [facility]   Assessment/plan status:  Information[facility] to [facility] Other assessment/ plan:   Information/referral to community resources:   CSW completed FL2 and faxed information to [facility], [facility] & they can take patient back when stable for discharge.    PATIENT'S/FAMILY'S RESPONSE TO PLAN OF CARE: Patient's daughter states that she plans for patient to return to [facility], that she's been pleased with the care her mother has been receiving.       Emily MaxinKelly Cas Tracz, LCSW [facility] Clinical Social Worker cell #: [phone]

## 2013-09-25 NOTE — Progress Notes (Signed)
Report called to [facility] to RN. Will discharge patient at this time. Daughter to take patient back to facility. Setzer, Don BroachAllison Marie

## 2013-09-25 NOTE — Discharge Summary (Signed)
Physician Discharge Summary  [name] ZOX:[id number] DOB: 23-Sep-1915 DOA: 09/22/2013  PCP: Kimber Relic, MD  Admit date: 09/22/2013 Discharge date: 09/25/2013  Recommendations for Outpatient Follow-up:  1. Continue levofloxacin + flagyl through 8/5, then stop.   2. Vaseline with dry dressing over scalp laceration daily or as needed 3. Please continue to address goals of care.   4. D/c ditropan XL because contraindicated in setting of dementia/confusion and because not crushable 5. Recommend please trying to minimize diazepam as much as possible 6. Repeat BMP and CBC in 1 week   Discharge Diagnoses:  Principal Problem:   HCAP (healthcare-associated pneumonia) Active Problems:   Type II or unspecified type diabetes mellitus without mention of complication, uncontrolled   Dysphagia, oropharyngeal phase   Major depressive disorder, recurrent episode, severe, without mention of psychotic behavior   Alzheimer's disease   Unspecified hearing loss   Unspecified essential hypertension   Discharge Condition: stable, improved  Diet recommendation: dysphagia 3 with thin liquids  Dysphagia 3 (Mechanical Soft);Thin liquid  Liquid Administration via: Cup;Straw  Medication Administration:  Crush with applesauce Supervision: Staff to assist with self feeding;Full supervision/cueing for compensatory strategies  Compensations: Slow rate;Small sips/bites  Postural Changes and/or Swallow Maneuvers: Seated upright 90 degrees;Upright 30-60 min after meal    Wt Readings from Last 3 Encounters:  09/25/13 54.5 kg (120 lb 2.4 oz)  07/09/13 56.019 kg (123 lb 8 oz)  05/07/13 53.298 kg (117 lb 8 oz)    History of present illness:  [name] is a 80 y.o. female with Past medical history of Alzheimer's dementia, diabetes, recurrent fall, dysphagia, recurrent pneumonia.  Patient was brought in from the nursing home with a mechanical fall. The fall was unwitnessed and the patient is  unable to provide any history.  As per the documentation the patient was found on the nursing facility on the floor on regular checkup. She was found to have injury on the right 4 had. She was awake and at her baseline and did not have any loss of consciousness as per the staff. She was not found to have any fever nausea vomiting diarrhea recently no recent change in her medication.  The patient is coming from ALF. And at her baseline dependent for most of her ADL.  Hospital Course:   Acute on chronic hypoxic respiratory failure secondary to atelectasis versus possible healthcare associated pneumonia. She was started on vancomycin and levofloxacin. She remained afebrile however she did have a leukocytosis. Her leukocytosis trended down after starting antibiotics. Recommend that she continue oxygen 2 L continuously secondary to probable scarring from recurrent pneumonias.  Recommend levofloxacin plus flagyl due to risk of aspiration.    Reported advanced dementia with behavioral changes such as recurrent pneumonia, dysphasia, recurrent falls. Her family is in denial and states that the patient never knew what month or season it is outside and minimize her symptoms. They do voice awareness that she will continue to have falls and recurrent infections that may become more frequent and more severe. Recommended palliative care, however the family wants full measures and declined palliative care at this time. They do not want to talk about goals of care.   Minimally elevated point-of-care troponins, however the blood troponin I was repeatedly negative. EKG demonstrated old MRI, but no acute ischemic changes. The patient was chest pain-free.  Borderline diabetes, defer to previous management.  Consultants:  none Procedures:  none Antibiotics:  Levofloxacin 7/27<<<  vanc 7/28<><<  Discharge Exam: Filed Vitals:  09/25/13 1400  BP: 123/64  Pulse: 89  Temp: 97.8 F (36.6 C)  Resp: 22   Filed  Vitals:   09/24/13 1300 09/24/13 2053 09/25/13 0457 09/25/13 1400  BP: 136/51 135/67 153/81 123/64  Pulse: 72 90 78 89  Temp: 97.6 F (36.4 C) 99.1 F (37.3 C) 98.5 F (36.9 C) 97.8 F (36.6 C)  TempSrc: Axillary Oral Oral Oral  Resp: 22 22 24 22   Height:      Weight:   54.5 kg (120 lb 2.4 oz)   SpO2: 90% 88% 94% 98%    General: Thin CF, NAD, blind and HOH HEENT:  Dressing c/d/i right forehead laceration, MMM Cardiovascular: IRRR, no mrg, 2+ pulses, warm extremities Respiratory:  CTAB but diminished at bases, no increased WOB ABD:  NABS, soft, NT/ND MSK:  No LEE, decreased bulk and tone  Discharge Instructions      Discharge Instructions   Call MD for:  difficulty breathing, headache or visual disturbances    Complete by:  As directed      Call MD for:  extreme fatigue    Complete by:  As directed      Call MD for:  hives    Complete by:  As directed      Call MD for:  persistant dizziness or light-headedness    Complete by:  As directed      Call MD for:  persistant nausea and vomiting    Complete by:  As directed      Call MD for:  severe uncontrolled pain    Complete by:  As directed      Call MD for:  temperature >100.4    Complete by:  As directed      Increase activity slowly    Complete by:  As directed             Medication List    STOP taking these medications       oxybutynin 5 MG 24 hr tablet  Commonly known as:  DITROPAN-XL      TAKE these medications       acetaminophen 325 MG tablet  Commonly known as:  TYLENOL  Take 650 mg by mouth. Take two tablets twice  daily as needed for pain.     amLODipine 10 MG tablet  Commonly known as:  NORVASC  Take 10 mg by mouth daily. Take 1 tablet daily to treat high blood pressure.     aspirin EC 81 MG tablet  Take 1 tablet (81 mg total) by mouth daily.     CALTRATE 600+D PLUS PO  Take by mouth daily. Take one tablet twice daily for calcium.     CENTRUM SILVER ADULT 50+ PO  Take by mouth daily.      chlorpheniramine 4 MG tablet  Commonly known as:  CHLOR-TRIMETON  Take 4 mg by mouth 2 (two) times daily as needed for allergies. Take 1 tablet every 8 hours as needed     diazepam 5 MG tablet  Commonly known as:  VALIUM  Take 1/2 tablet by mouth once daily.     diazepam 5 MG tablet  Commonly known as:  VALIUM  Take 0.5 tablets (2.5 mg total) by mouth every 6 (six) hours as needed for anxiety.     enalapril 10 MG tablet  Commonly known as:  VASOTEC  Take 10 mg by mouth daily. Take one daily at 2 pm to control BP.     lactose free nutrition Liqd  Take 237 mLs by mouth. One serving once a day     levofloxacin 750 MG tablet  Commonly known as:  LEVAQUIN  Take 1 tablet (750 mg total) by mouth daily.     metroNIDAZOLE 500 MG tablet  Commonly known as:  FLAGYL  Take 1 tablet (500 mg total) by mouth 3 (three) times daily.     mirtazapine 15 MG tablet  Commonly known as:  REMERON  Take 15 mg by mouth 2 (two) times daily. Take one at 9 am and 4 pm to help with depression.     NAMENDA XR 14 MG Cp24  Generic drug:  Memantine HCl ER  Take 1 capsule by mouth daily.     PARoxetine 40 MG tablet  Commonly known as:  PAXIL  Take 40 mg by mouth every morning. Give 1 daily to treat depression/anxiety.     polyvinyl alcohol 1.4 % ophthalmic solution  Commonly known as:  LIQUIFILM TEARS  1 drop. One drop both eyes twice daily as needed for itchy eyes, every 12 hours as needed     rivastigmine 9.5 mg/24hr  Commonly known as:  EXELON  Place 1 patch (9.5 mg total) onto the skin daily.     senna 8.6 MG tablet  Commonly known as:  SENOKOT  Take 1 tablet by mouth daily. Take 1 every night for laxative - hold for loose stool.     sodium chloride 0.65 % nasal spray  Commonly known as:  OCEAN  Place 1 spray into the nose. Give one spray nasal twice a day       Follow-up Information   Follow up with GREEN, Lenon CurtARTHUR G, MD. Schedule an appointment as soon as possible for a visit in 1  week.   Specialty:  Internal Medicine   Contact information:   [address] [place] [address] [phone]        The results of significant diagnostics from this hospitalization (including imaging, microbiology, ancillary and laboratory) are listed below for reference.    Significant Diagnostic Studies: Ct Head Wo Contrast  09/23/2013   CLINICAL DATA:  Larey SeatFell tonight with laceration to the right forehead. No loss of consciousness.  EXAM: CT HEAD WITHOUT CONTRAST  CT CERVICAL SPINE WITHOUT CONTRAST  TECHNIQUE: Multidetector CT imaging of the head and cervical spine was performed following the standard protocol without intravenous contrast. Multiplanar CT image reconstructions of the cervical spine were also generated.  COMPARISON:  None.  FINDINGS: CT HEAD FINDINGS  Diffuse cerebral atrophy. Ventricular dilatation most likely due to central atrophy. Extensive with low-attenuation changes throughout the deep white matter consistent with small vessel ischemic changes. Areas of encephalomalacia extending to the cortical surfaces in both frontal and parietal regions as well as in the cerebellum consistent with old infarcts. Extensive vascular calcifications. No mass effect or midline shift. No abnormal extra-axial fluid collections. Gray-white matter junctions are distinct. Basal cisterns are not effaced. No evidence of acute intracranial hemorrhage. No depressed skull fractures. Mucosal thickening or retention cysts in the frontal sinuses. No acute air-fluid levels. Subcutaneous scalp laceration over the right anterior frontal region.  CT CERVICAL SPINE FINDINGS  Diffuse bone demineralization. Straightening of the usual cervical lordosis which may be due to patient positioning or degenerative changes. However, ligamentous injury or muscle spasm could also have this appearance in are not excluded. Slight anterior subluxation of C3 on C4 is likely to be degenerative. Prominent degenerative changes  throughout the facet joints with coalition of C2 through  C4 posterior elements. Prominent hypertrophic changes demonstrated at C5-6 and C6-7 levels with prominent disc osteophyte complexes at C5-6. No vertebral compression deformities. No prevertebral soft tissue swelling. C1-2 articulation appears intact with prominent degenerative narrowing present. Vascular calcifications in the cervical carotid and vertebral arteries. Emphysematous changes in the lung apices. Calcified granuloma in the left upper lung.  IMPRESSION: No acute intracranial abnormalities. 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  Diffuse degenerative change throughout the cervical spine. Nonspecific straightening of the usual cervical lordosis. No displaced fractures identified.   Electronically Signed   By: Burman Nieves M.D.   On: 09/23/2013 01:31   Ct Cervical Spine Wo Contrast  09/23/2013   CLINICAL DATA:  Larey Seat tonight with laceration to the right forehead. No loss of consciousness.  EXAM: CT HEAD WITHOUT CONTRAST  CT CERVICAL SPINE WITHOUT CONTRAST  TECHNIQUE: Multidetector CT imaging of the head and cervical spine was performed following the standard protocol without intravenous contrast. Multiplanar CT image reconstructions of the cervical spine were also generated.  COMPARISON:  None.  FINDINGS: CT HEAD FINDINGS  Diffuse cerebral atrophy. Ventricular dilatation most likely due to central atrophy. Extensive with low-attenuation changes throughout the deep white matter consistent with small vessel ischemic changes. Areas of encephalomalacia extending to the cortical surfaces in both frontal and parietal regions as well as in the cerebellum consistent with old infarcts. Extensive vascular calcifications. No mass effect or midline shift. No abnormal extra-axial fluid collections. Gray-white matter junctions are distinct. Basal cisterns are not effaced. No evidence of acute intracranial  hemorrhage. No depressed skull fractures. Mucosal thickening or retention cysts in the frontal sinuses. No acute air-fluid levels. Subcutaneous scalp laceration over the right anterior frontal region.  CT CERVICAL SPINE FINDINGS  Diffuse bone demineralization. Straightening of the usual cervical lordosis which may be due to patient positioning or degenerative changes. However, ligamentous injury or muscle spasm could also have this appearance in are not excluded. Slight anterior subluxation of C3 on C4 is likely to be degenerative. Prominent degenerative changes throughout the facet joints with coalition of C2 through C4 posterior elements. Prominent hypertrophic changes demonstrated at C5-6 and C6-7 levels with prominent disc osteophyte complexes at C5-6. No vertebral compression deformities. No prevertebral soft tissue swelling. C1-2 articulation appears intact with prominent degenerative narrowing present. Vascular calcifications in the cervical carotid and vertebral arteries. Emphysematous changes in the lung apices. Calcified granuloma in the left upper lung.  IMPRESSION: No acute intracranial abnormalities. 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  Diffuse degenerative change throughout the cervical spine. Nonspecific straightening of the usual cervical lordosis. No displaced fractures identified.   Electronically Signed   By: Burman Nieves M.D.   On: 09/23/2013 01:31   Dg Chest Port 1 View  09/23/2013   CLINICAL DATA:  Fall.  EXAM: PORTABLE CHEST - 1 VIEW  COMPARISON:  Chest radiograph April 06, 2010  FINDINGS: The cardiac silhouette appears mildly enlarged, tortuous calcified aortic. Mild chronic interstitial changes, strandy densities in left lung base. No pleural effusions. No pneumothorax. Surgical clips project in right lung apex. Mild right shoulder intra-articular calcifications could reflect calcific tendinopathy. Patient is osteopenic. Multiple  EKG lines overlie the patient and may obscure subtle underlying pathology.  IMPRESSION: Mild cardiomegaly and chronic interstitial changes strandy density in the left lung base likely reflecting atelectasis.   Electronically Signed   By: Awilda Metro   On: 09/23/2013 01:30    Microbiology: Recent  Results (from the past 240 hour(s))  MRSA PCR SCREENING     Status: None   Collection Time    09/23/13  6:20 AM      Result Value Ref Range Status   MRSA by PCR NEGATIVE  NEGATIVE Final   Comment:            The GeneXpert MRSA Assay (FDA     approved for NASAL specimens     only), is one component of a     comprehensive MRSA colonization     surveillance program. It is not     intended to diagnose MRSA     infection nor to guide or     monitor treatment for     MRSA infections.     Labs: Basic Metabolic Panel:  Recent Labs Lab 09/23/13 0042  NA 136*  K 4.3  CL 96  CO2 23  GLUCOSE 150*  BUN 21  CREATININE 0.85  CALCIUM 9.5   Liver Function Tests:  Recent Labs Lab 09/23/13 0042  AST 22  ALT 15  ALKPHOS 127*  BILITOT <0.2*  PROT 7.3  ALBUMIN 3.6   No results found for this basename: LIPASE, AMYLASE,  in the last 168 hours No results found for this basename: AMMONIA,  in the last 168 hours CBC:  Recent Labs Lab 09/23/13 0042 09/24/13 0110  WBC 15.9* 12.1*  NEUTROABS 13.2*  --   HGB 13.2 14.2  HCT 40.6 43.0  MCV 94.0 95.1  PLT 457* 426*   Cardiac Enzymes:  Recent Labs Lab 09/23/13 1418 09/23/13 1855 09/24/13 0110  TROPONINI <0.30 <0.30 <0.30   BNP: BNP (last 3 results) No results found for this basename: PROBNP,  in the last 8760 hours CBG: No results found for this basename: GLUCAP,  in the last 168 hours  Time coordinating discharge: 45 minutes  Signed:  Shay Jhaveri  [facility] 09/25/2013, 3:44 PM

## 2013-09-26 ENCOUNTER — Non-Acute Institutional Stay (SKILLED_NURSING_FACILITY): Payer: Medicare Other | Admitting: Internal Medicine

## 2013-09-26 DIAGNOSIS — E119 Type 2 diabetes mellitus without complications: Secondary | ICD-10-CM

## 2013-09-26 DIAGNOSIS — S0180XA Unspecified open wound of other part of head, initial encounter: Secondary | ICD-10-CM

## 2013-09-26 DIAGNOSIS — Z9181 History of falling: Secondary | ICD-10-CM

## 2013-09-26 DIAGNOSIS — R1312 Dysphagia, oropharyngeal phase: Secondary | ICD-10-CM

## 2013-09-26 DIAGNOSIS — I10 Essential (primary) hypertension: Secondary | ICD-10-CM

## 2013-09-26 DIAGNOSIS — J189 Pneumonia, unspecified organism: Secondary | ICD-10-CM

## 2013-09-26 DIAGNOSIS — H9190 Unspecified hearing loss, unspecified ear: Secondary | ICD-10-CM

## 2013-09-26 DIAGNOSIS — S0181XA Laceration without foreign body of other part of head, initial encounter: Secondary | ICD-10-CM

## 2013-09-26 DIAGNOSIS — F0280 Dementia in other diseases classified elsewhere without behavioral disturbance: Secondary | ICD-10-CM

## 2013-09-26 DIAGNOSIS — G309 Alzheimer's disease, unspecified: Secondary | ICD-10-CM

## 2013-10-01 ENCOUNTER — Non-Acute Institutional Stay: Payer: Medicare Other | Admitting: Nurse Practitioner

## 2013-10-01 DIAGNOSIS — F0280 Dementia in other diseases classified elsewhere without behavioral disturbance: Secondary | ICD-10-CM

## 2013-10-01 DIAGNOSIS — F332 Major depressive disorder, recurrent severe without psychotic features: Secondary | ICD-10-CM

## 2013-10-01 DIAGNOSIS — G309 Alzheimer's disease, unspecified: Secondary | ICD-10-CM

## 2013-10-01 DIAGNOSIS — E119 Type 2 diabetes mellitus without complications: Secondary | ICD-10-CM

## 2013-10-01 DIAGNOSIS — W19XXXA Unspecified fall, initial encounter: Secondary | ICD-10-CM

## 2013-10-01 DIAGNOSIS — J189 Pneumonia, unspecified organism: Secondary | ICD-10-CM

## 2013-10-01 DIAGNOSIS — Z5189 Encounter for other specified aftercare: Secondary | ICD-10-CM

## 2013-10-01 DIAGNOSIS — I10 Essential (primary) hypertension: Secondary | ICD-10-CM

## 2013-10-01 DIAGNOSIS — W19XXXD Unspecified fall, subsequent encounter: Secondary | ICD-10-CM

## 2013-10-01 NOTE — Progress Notes (Signed)
Patient ID: [name], female   DOB: Jun 29, 1915, 80 y.o.   MRN: [id number]    Nursing Home Location:  [facility]   Place of Service: ALF (13)  PCP: Kimber Relic, MD  Allergies  Allergen Reactions  . Bupropion   . Ciprocinonide [Fluocinolone]   . Codeine   . Halcion [Triazolam]   . Nortriptyline   . Phosphate   . Septra [Sulfamethoxazole-Trimethoprim]   . Sulfa Antibiotics   . Ultram [Tramadol]     Chief Complaint  Patient presents with  . Medical Management of Chronic Issues    HPI:  This is a 80 y.o. female resident of [facility],  Memory Care section with a past medical history of Alzheimer's dementia, diabetes, recurrent fall, dysphagia, recurrent pneumonia. Who was recently hospitalized due to fall with laceration requiring sutures and was admitted due to drop in sats and found to have HCAP where she was treated. Pt with increase lethargy therefore medications were minimized to reduce side effects. Staff reports pt is now getting back to baseline.   Review of Systems:  Unable to obtain  Past Medical History  Diagnosis Date  . Encounter for long-term (current) use of other medications 09/06/2011  . Closed fracture of other bone of wrist 03/15/2011  . Retention of urine, unspecified 02/24/2011  . Dysphagia, oropharyngeal phase 04/09/2010  . Pneumonitis due to inhalation of food or vomitus 04/06/2010  . Abnormality of gait 08/24/2009  . Iron deficiency anemia, unspecified 07/29/2009  . Personal history of fall 07/29/2009  . Pain in joint, lower leg 06/17/2009  . Type II or unspecified type diabetes mellitus without mention of complication, not stated as uncontrolled 08/18/2008  . Rickets, active 02/08/2008  . Allergic rhinitis due to pollen 07/18/2007  . Lichenification and lichen simplex chronicus 07/18/2007  . Unspecified urinary incontinence 07/18/2007  . Leukoplakia of oral mucosa, including tongue 01/01/2007    . Major depressive disorder, recurrent episode, severe, without mention of psychotic behavior 12/18/2006  . Alzheimer's disease 04/03/2006  . Diverticulosis of colon (without mention of hemorrhage) 02/20/2006  . Unspecified constipation 01/10/2006  . Heartburn 01/10/2006  . Unspecified disease of pericardium 01/01/2206  . Type II or unspecified type diabetes mellitus without mention of complication, uncontrolled 06/13/2005  . Edema 12/20/2004  . Postherpetic polyneuropathy 10/20/2004  . Lumbago 08/18/2004  . Muscle weakness (generalized) 01/16/2004  . Dizziness and giddiness 09/24/2003  . Insomnia, unspecified 09/24/2003  . Pain in limb 09/03/2003  . Pure hypercholesterolemia 07/16/2003  . Anxiety state, unspecified 07/16/2003  . Macular degeneration (senile) of retina, unspecified 07/16/2003  . Coronary atherosclerosis of unspecified type of vessel, native or graft 07/16/2003  . Acute, but ill-defined, cerebrovascular disease 07/16/2003  . Chronic airway obstruction, not elsewhere classified 07/16/2003  . Esophageal reflux 07/16/2003  . Osteoarthrosis, unspecified whether generalized or localized, unspecified site 07/16/2003  . Displacement of cervical intervertebral disc without myelopathy 07/16/2003  . Osteoporosis, unspecified 07/16/2003  . Unspecified hearing loss 03/11/2003  . Unspecified essential hypertension 07/03/2002  . Other specified disease of sebaceous glands 04/15/2002   Past Surgical History  Procedure Laterality Date  . Appendectomy    . Tonsillectomy    . Carpal tunnel release    . Eye surgery Left     cataracts  . Axillary artery - femoral artery bypass graft Right 2005  . Colonoscopy  02/06/2004    nl exam. Suspect tear in diverticula   Social History:   reports that she quit smoking about  12 years ago. She does not have any smokeless tobacco history on file. Her alcohol and drug histories are not on file.  Family History  Problem Relation Age of Onset   . Stroke Mother   . Heart disease Father     Medications: Patient's Medications  New Prescriptions   No medications on file  Previous Medications   ACETAMINOPHEN (TYLENOL) 325 MG TABLET    Take 650 mg by mouth. Take two tablets twice  daily as needed for pain.   AMLODIPINE (NORVASC) 10 MG TABLET    Take 10 mg by mouth daily. Take 1 tablet daily to treat high blood pressure.   ASPIRIN EC 81 MG TABLET    Take 1 tablet (81 mg total) by mouth daily.   DIAZEPAM (VALIUM) 5 MG TABLET    Take 0.5 tablets (2.5 mg total) by mouth every 6 (six) hours as needed for anxiety.   ENALAPRIL (VASOTEC) 10 MG TABLET    Take 10 mg by mouth daily. Take one daily at 2 pm to control BP.   LACTOSE FREE NUTRITION (BOOST PLUS) LIQD    Take 237 mLs by mouth. One serving once a day   MEMANTINE HCL ER (NAMENDA XR) 14 MG CP24    Take 1 capsule by mouth daily.   PAROXETINE (PAXIL) 40 MG TABLET    Take 40 mg by mouth every morning. Give 1 daily to treat depression/anxiety.   POLYVINYL ALCOHOL (LIQUIFILM TEARS) 1.4 % OPHTHALMIC SOLUTION    1 drop. One drop both eyes twice daily as needed for itchy eyes, every 12 hours as needed   RIVASTIGMINE (EXELON) 9.5 MG/24HR    Place 1 patch (9.5 mg total) onto the skin daily.   SENNA (SENOKOT) 8.6 MG TABLET    Take 1 tablet by mouth daily. Take 1 every night for laxative - hold for loose stool.   SODIUM CHLORIDE (OCEAN) 0.65 % NASAL SPRAY    Place 1 spray into the nose. Give one spray nasal twice a day  Modified Medications   No medications on file  Discontinued Medications   CALCIUM CARBONATE-VIT D-MIN (CALTRATE 600+D PLUS PO)    Take by mouth daily. Take one tablet twice daily for calcium.   CHLORPHENIRAMINE (CHLOR-TRIMETON) 4 MG TABLET    Take 4 mg by mouth 2 (two) times daily as needed for allergies. Take 1 tablet every 8 hours as needed   DIAZEPAM (VALIUM) 5 MG TABLET    Take 1/2 tablet by mouth once daily.   LEVOFLOXACIN (LEVAQUIN) 750 MG TABLET    Take 1 tablet (750 mg  total) by mouth daily.   METRONIDAZOLE (FLAGYL) 500 MG TABLET    Take 1 tablet (500 mg total) by mouth 3 (three) times daily.   MIRTAZAPINE (REMERON) 15 MG TABLET    Take 15 mg by mouth 2 (two) times daily. Take one at 9 am and 4 pm to help with depression.   MULTIPLE VITAMINS-MINERALS (CENTRUM SILVER ADULT 50+ PO)    Take by mouth daily.     Physical Exam:  Filed Vitals:   10/01/13 1335  BP: 145/77  Pulse: 92  Temp: 99.8 F (37.7 C)  Resp: 18  Weight: 121 lb (54.885 kg)  SpO2: 91%   Labs reviewed: Basic Metabolic Panel:  Recent Labs  40/98/11 09/23/13 0042  NA 139 136*  K 4.4 4.3  CL  --  96  CO2  --  23  GLUCOSE  --  150*  BUN 14 21  CREATININE 0.8 0.85  CALCIUM  --  9.5   Liver Function Tests:  Recent Labs  05/09/13 09/23/13 0042  AST 19 22  ALT 13 15  ALKPHOS 84 127*  BILITOT  --  <0.2*  PROT  --  7.3  ALBUMIN  --  3.6   No results found for this basename: LIPASE, AMYLASE,  in the last 8760 hours No results found for this basename: AMMONIA,  in the last 8760 hours CBC:  Recent Labs  05/09/13 09/23/13 0042 09/24/13 0110  WBC 12.4 15.9* 12.1*  NEUTROABS 39 13.2*  --   HGB 12.8 13.2 14.2  HCT 39 40.6 43.0  MCV  --  94.0 95.1  PLT 437* 457* 426*   CBG: No results found for this basename: GLUCAP,  in the last 8760 hours TSH:  Recent Labs  05/09/13  TSH 4.89   A1C: Lab Results  Component Value Date   HGBA1C 6.5* 05/09/2013      GENERAL APPEARANCE: No acute distress, appropriately groomed, alert, pleasant, conversant SKIN: No diaphoresis, rash,  HEAD: Normocephalic,well healed laceration to right forehead with sutures  EARS: Very poor hearing . RESPIRATORY: Breathing is even, unlabored. Lungs clear CARDIOVASCULAR: Heart RRR. No murmur or extra heart sounds             EDEMA: No peripheral edema.   GASTROINTESTINAL: Abdomen is soft, non-tender, not distended w/ normal bowel sounds. MS: walks with walker, no tenderness to spine or  paraspinal muscles on exam NEUROLOGIC:  not oriented to time or place(usual) , oriented to person. Speech is clear, no tremor.   PSYCHIATRIC: no anxiety or depression, with dementia.      Assessment/Plan  1. HCAP (healthcare-associated pneumonia) -completed flagyl and Levaquin -O2 at baseline -will follow up CBC since pt had leukocytosis from hospital    2. Type II or unspecified type diabetes mellitus without mention of complication, not stated as uncontrolled -controlled without medication, will follow up A1c  3. Major depressive disorder, recurrent episode, severe, without mention of psychotic behavior -conts on paxil   4. Alzheimer's disease -remains stable  5. Unspecified essential hypertension -stable at this time  6. Fall, subsequent encounter -will have staff remove sutures and have staff apply steristrips -medications reviewed and minimized to reduce lethargy from side effects

## 2013-10-03 LAB — CBC AND DIFFERENTIAL
HCT: 32 % — AB (ref 36–46)
HEMOGLOBIN: 10.6 g/dL — AB (ref 12.0–16.0)
Platelets: 595 10*3/uL — AB (ref 150–399)
WBC: 13.3 10^3/mL

## 2013-10-03 LAB — HEMOGLOBIN A1C: HEMOGLOBIN A1C: 7 % — AB (ref 4.0–6.0)

## 2013-10-03 LAB — BASIC METABOLIC PANEL
BUN: 8 mg/dL (ref 4–21)
Creatinine: 0.5 mg/dL (ref 0.5–1.1)
Glucose: 118 mg/dL
POTASSIUM: 4.1 mmol/L (ref 3.4–5.3)
SODIUM: 135 mmol/L — AB (ref 137–147)

## 2013-11-03 ENCOUNTER — Encounter: Payer: Self-pay | Admitting: Internal Medicine

## 2013-11-03 NOTE — Progress Notes (Signed)
Patient ID: [name], female   DOB: 01-May-1915, 80 y.o.   MRN: [id number]    Location:  [facility]   Place of Service: SNF (31)  Extended Emergency Contact Information Primary Emergency Contact: Ivey,Kay Address: [address]          [place] [address] [place] Home Phone: [phone] Mobile Phone: [phone] Relation: Daughter   Chief Complaint  Patient presents with  . Readmit To SNF    following hospitalization    HPI:  Readmit to SNF on 09/25/13 following hospitalization 09/22/13 to 09/25/13. She had a fall that resulted in about a 3.5 inch laceration to the forehead, Level of alertness was low. CXR in ER showed an area of stranded density that likely was atelectasis, but could have represented HCAP. She was admitted for continued observation and initiation of treatment of possible pneumonia. She was afebrile on admission, but had elevated leukocytes.  Multiple other issues are present in this frail demented woman. She has  known severe Alzheimer's disease, dysphagia 3 oropharyngeal phase, DM, severe hearing loss, and hypertension.  She is in worse condition on return than was her usual condition prior to her fall. She is not answering or following commands. Intake is poor.   She is NCB.   Past Medical History  Diagnosis Date  . Encounter for long-term (current) use of other medications 09/06/2011  . Closed fracture of other bone of wrist 03/15/2011  . Retention of urine, unspecified 02/24/2011  . Dysphagia, oropharyngeal phase 04/09/2010  . Pneumonitis due to inhalation of food or vomitus 04/06/2010  . Abnormality of gait 08/24/2009  . Iron deficiency anemia, unspecified 07/29/2009  . Personal history of fall 07/29/2009  . Pain in joint, lower leg 06/17/2009  . Type II or unspecified type diabetes mellitus without mention of complication, not stated as uncontrolled 08/18/2008  . Rickets, active 02/08/2008  . Allergic  rhinitis due to pollen 07/18/2007  . Lichenification and lichen simplex chronicus 07/18/2007  . Unspecified urinary incontinence 07/18/2007  . Leukoplakia of oral mucosa, including tongue 01/01/2007  . Major depressive disorder, recurrent episode, severe, without mention of psychotic behavior 12/18/2006  . Alzheimer's disease 04/03/2006  . Diverticulosis of colon (without mention of hemorrhage) 02/20/2006  . Unspecified constipation 01/10/2006  . Heartburn 01/10/2006  . Unspecified disease of pericardium 01/01/2206  . Type II or unspecified type diabetes mellitus without mention of complication, uncontrolled 06/13/2005  . Edema 12/20/2004  . Postherpetic polyneuropathy 10/20/2004  . Lumbago 08/18/2004  . Muscle weakness (generalized) 01/16/2004  . Dizziness and giddiness 09/24/2003  . Insomnia, unspecified 09/24/2003  . Pain in limb 09/03/2003  . Pure hypercholesterolemia 07/16/2003  . Anxiety state, unspecified 07/16/2003  . Macular degeneration (senile) of retina, unspecified 07/16/2003  . Coronary atherosclerosis of unspecified type of vessel, native or graft 07/16/2003  . Acute, but ill-defined, cerebrovascular disease 07/16/2003  . Chronic airway obstruction, not elsewhere classified 07/16/2003  . Esophageal reflux 07/16/2003  . Osteoarthrosis, unspecified whether generalized or localized, unspecified site 07/16/2003  . Displacement of cervical intervertebral disc without myelopathy 07/16/2003  . Osteoporosis, unspecified 07/16/2003  . Unspecified hearing loss 03/11/2003  . Unspecified essential hypertension 07/03/2002  . Other specified disease of sebaceous glands 04/15/2002  . Laceration of forehead 09/22/13  . History of fall 09/22/13  . Cardiomegaly 09/22/13    Past Surgical History  Procedure Laterality Date  . Appendectomy    . Tonsillectomy    . Carpal tunnel release    . Eye  surgery Left     cataracts  . Axillary artery - femoral artery bypass graft Right 2005  .  Colonoscopy  02/06/2004    nl exam. Suspect tear in diverticula    History   Social History  . Marital Status: Married    Spouse Name: N/A    Number of Children: N/A  . Years of Education: N/A   Occupational History  . Not on file.   Social History Main Topics  . Smoking status: Former Smoker    Quit date: 09/11/2001  . Smokeless tobacco: Not on file  . Alcohol Use: Not on file  . Drug Use: Not on file  . Sexual Activity: Not on file   Other Topics Concern  . Not on file   Social History Narrative   Widowed 2007. Homemaker.  Patient lives in [place] section at [facility] since 03/2010, previously lived in the [place] section since 04/2007, and in [place] apartment in same community since 2006.   Patient has Advanced planning documents: Living Will   Quit smoking cigarettes 2003, moderate alcohol history (daily glass of wine)           reports that she quit smoking about 12 years ago. She does not have any smokeless tobacco history on file. Her alcohol and drug histories are not on file.  Immunization History  Administered Date(s) Administered  . Influenza Whole 12/06/2011, 12/11/2012  . PPD Test 04/08/2010  . Pneumococcal Polysaccharide-23 01/10/2006, 02/24/2011  . Td 04/23/2007  . Tdap 09/23/2013    Allergies  Allergen Reactions  . Bupropion   . Ciprocinonide [Fluocinolone]   . Codeine   . Halcion [Triazolam]   . Nortriptyline   . Phosphate   . Septra [Sulfamethoxazole-Trimethoprim]   . Sulfa Antibiotics   . Ultram [Tramadol]     Medications: Patient's Medications  New Prescriptions   No medications on file  Previous Medications   ACETAMINOPHEN (TYLENOL) 325 MG TABLET    Take 650 mg by mouth. Take two tablets twice  daily as needed for pain.   AMLODIPINE (NORVASC) 10 MG TABLET    Take 10 mg by mouth daily. Take 1 tablet daily to treat high blood pressure.   ASPIRIN EC 81 MG TABLET    Take 1 tablet (81 mg total) by mouth daily.   DIAZEPAM  (VALIUM) 5 MG TABLET    Take 0.5 tablets (2.5 mg total) by mouth every 6 (six) hours as needed for anxiety.   ENALAPRIL (VASOTEC) 10 MG TABLET    Take 10 mg by mouth daily. Take one daily at 2 pm to control BP.   LACTOSE FREE NUTRITION (BOOST PLUS) LIQD    Take 237 mLs by mouth. One serving once a day   MEMANTINE HCL ER (NAMENDA XR) 14 MG CP24    Take 1 capsule by mouth daily.   PAROXETINE (PAXIL) 40 MG TABLET    Take 40 mg by mouth every morning. Give 1 daily to treat depression/anxiety.   POLYVINYL ALCOHOL (LIQUIFILM TEARS) 1.4 % OPHTHALMIC SOLUTION    1 drop. One drop both eyes twice daily as needed for itchy eyes, every 12 hours as needed   RIVASTIGMINE (EXELON) 9.5 MG/24HR    Place 1 patch (9.5 mg total) onto the skin daily.   SENNA (SENOKOT) 8.6 MG TABLET    Take 1 tablet by mouth daily. Take 1 every night for laxative - hold for loose stool.   SODIUM CHLORIDE (OCEAN) 0.65 % NASAL SPRAY  Place 1 spray into the nose. Give one spray nasal twice a day  Modified Medications   No medications on file  Discontinued Medications   No medications on file     Review of Systems  Constitutional: Positive for activity change and appetite change. Negative for fever, diaphoresis and fatigue.  HENT: Positive for hearing loss and trouble swallowing. Negative for congestion, mouth sores and sore throat.   Eyes: Positive for visual disturbance (corrective lenses).  Respiratory: Negative for apnea, cough, choking, chest tightness and shortness of breath.   Cardiovascular: Negative for chest pain, palpitations and leg swelling.  Gastrointestinal: Negative for abdominal pain and abdominal distention.  Endocrine:       DM  Genitourinary: Negative for difficulty urinating.       Ijncontinent  Musculoskeletal: Negative.   Skin: Negative.   Allergic/Immunologic: Negative.   Neurological:       Severely demented  Hematological: Negative.   Psychiatric/Behavioral: Negative for hallucinations, behavioral  problems and agitation. The patient is not nervous/anxious.     Filed Vitals:   09/26/13 0636  BP: 120/70  Pulse: 80  Temp: 97.8 F (36.6 C)  Resp: 16  Height: 5' (1.524 m)  Weight: 120 lb (54.432 kg)   Body mass index is 23.44 kg/(m^2).  Physical Exam  Constitutional: She is oriented to person, place, and time.  Frail, elderly in poor condition.  HENT:  Severe hearing loss  Eyes: Conjunctivae and EOM are normal. Pupils are equal, round, and reactive to light.  Neck: No JVD present. No tracheal deviation present. No thyromegaly present.  Cardiovascular: Normal rate, regular rhythm and intact distal pulses.  Exam reveals no friction rub.   No murmur heard. 3-4/ 6 SEM  Pulmonary/Chest: No respiratory distress. She has no wheezes. She has no rales. She exhibits no tenderness.  Abdominal: She exhibits no distension and no mass. There is no tenderness. There is no rebound.  Musculoskeletal: Normal range of motion. She exhibits no edema and no tenderness.  Lymphadenopathy:    She has no cervical adenopathy.  Neurological: She is alert and oriented to person, place, and time. She has normal reflexes. No cranial nerve deficit. Coordination normal.  Non verbal and staring.  Skin: No rash noted. No erythema. No pallor.  3.5 inch laceration of the forehead that has staples.  Psychiatric:  Withdrawn and non verbal     Labs reviewed: Admission on 09/22/2013, Discharged on 09/25/2013  Component Date Value Ref Range Status  . WBC 09/23/2013 15.9* 4.0 - 10.5 K/uL Final  . RBC 09/23/2013 4.32  3.87 - 5.11 MIL/uL Final  . Hemoglobin 09/23/2013 13.2  12.0 - 15.0 g/dL Final  . HCT 97/13/7345 40.6  36.0 - 46.0 % Final  . MCV 09/23/2013 94.0  78.0 - 100.0 fL Final  . MCH 09/23/2013 30.6  26.0 - 34.0 pg Final  . MCHC 09/23/2013 32.5  30.0 - 36.0 g/dL Final  . RDW 63/46/1718 14.0  11.5 - 15.5 % Final  . Platelets 09/23/2013 457* 150 - 400 K/uL Final  . Neutrophils Relative % 09/23/2013 83*  43 - 77 % Final  . Neutro Abs 09/23/2013 13.2* 1.7 - 7.7 K/uL Final  . Lymphocytes Relative 09/23/2013 7* 12 - 46 % Final  . Lymphs Abs 09/23/2013 1.1  0.7 - 4.0 K/uL Final  . Monocytes Relative 09/23/2013 8  3 - 12 % Final  . Monocytes Absolute 09/23/2013 1.3* 0.1 - 1.0 K/uL Final  . Eosinophils Relative 09/23/2013 2  0 -  5 % Final  . Eosinophils Absolute 09/23/2013 0.3  0.0 - 0.7 K/uL Final  . Basophils Relative 09/23/2013 0  0 - 1 % Final  . Basophils Absolute 09/23/2013 0.0  0.0 - 0.1 K/uL Final  . Sodium 09/23/2013 136* 137 - 147 mEq/L Final  . Potassium 09/23/2013 4.3  3.7 - 5.3 mEq/L Final  . Chloride 09/23/2013 96  96 - 112 mEq/L Final  . CO2 09/23/2013 23  19 - 32 mEq/L Final  . Glucose, Bld 09/23/2013 150* 70 - 99 mg/dL Final  . BUN 09/23/2013 21  6 - 23 mg/dL Final  . Creatinine, Ser 09/23/2013 0.85  0.50 - 1.10 mg/dL Final  . Calcium 09/23/2013 9.5  8.4 - 10.5 mg/dL Final  . Total Protein 09/23/2013 7.3  6.0 - 8.3 g/dL Final  . Albumin 09/23/2013 3.6  3.5 - 5.2 g/dL Final  . AST 09/23/2013 22  0 - 37 U/L Final  . ALT 09/23/2013 15  0 - 35 U/L Final  . Alkaline Phosphatase 09/23/2013 127* 39 - 117 U/L Final  . Total Bilirubin 09/23/2013 <0.2* 0.3 - 1.2 mg/dL Final  . GFR calc non Af Amer 09/23/2013 56* >90 mL/min Final  . GFR calc Af Amer 09/23/2013 65* >90 mL/min Final   Comment: (NOTE)                          The eGFR has been calculated using the CKD EPI equation.                          This calculation has not been validated in all clinical situations.                          eGFR's persistently <90 mL/min signify possible Chronic Kidney                          Disease.  . Anion gap 09/23/2013 17* 5 - 15 Final  . Prothrombin Time 09/23/2013 12.8  11.6 - 15.2 seconds Final  . INR 09/23/2013 0.96  0.00 - 1.49 Final  . Color, Urine 09/23/2013 STRAW* YELLOW Final  . APPearance 09/23/2013 CLEAR  CLEAR Final  . Specific Gravity, Urine 09/23/2013 1.006  1.005 - 1.030  Final  . pH 09/23/2013 5.5  5.0 - 8.0 Final  . Glucose, UA 09/23/2013 NEGATIVE  NEGATIVE mg/dL Final  . Hgb urine dipstick 09/23/2013 NEGATIVE  NEGATIVE Final  . Bilirubin Urine 09/23/2013 NEGATIVE  NEGATIVE Final  . Ketones, ur 09/23/2013 NEGATIVE  NEGATIVE mg/dL Final  . Protein, ur 09/23/2013 NEGATIVE  NEGATIVE mg/dL Final  . Urobilinogen, UA 09/23/2013 0.2  0.0 - 1.0 mg/dL Final  . Nitrite 09/23/2013 NEGATIVE  NEGATIVE Final  . Leukocytes, UA 09/23/2013 NEGATIVE  NEGATIVE Final   MICROSCOPIC NOT DONE ON URINES WITH NEGATIVE PROTEIN, BLOOD, LEUKOCYTES, NITRITE, OR GLUCOSE <1000 mg/dL.  Marland Kitchen aPTT 09/23/2013 26  24 - 37 seconds Final  . Troponin i, poc 09/23/2013 0.11* 0.00 - 0.08 ng/mL Final  . Comment 3 09/23/2013          Final   Comment: Due to the release kinetics of cTnI,                          a negative result within the first hours  of the onset of symptoms does not rule out                          myocardial infarction with certainty.                          If myocardial infarction is still suspected,                          repeat the test at appropriate intervals.  . Troponin i, poc 09/23/2013 0.09* 0.00 - 0.08 ng/mL Final  . Comment 09/23/2013 NOTIFIED PHYSICIAN   Final  . Comment 3 09/23/2013          Final   Comment: Due to the release kinetics of cTnI,                          a negative result within the first hours                          of the onset of symptoms does not rule out                          myocardial infarction with certainty.                          If myocardial infarction is still suspected,                          repeat the test at appropriate intervals.  Marland Kitchen Specimen Description 09/23/2013 URINE, CLEAN CATCH   Final  . Special Requests 09/23/2013 NONE   Final  . Legionella Antigen, Urine 09/23/2013    Final                   Value:Negative for Legionella pneumophilia serogroup 1                         Performed at  [facility]  . Report Status 09/23/2013 09/24/2013 FINAL   Final  . Strep Pneumo Urinary Antigen 09/23/2013 NEGATIVE  NEGATIVE Final   Comment:                                 Infection due to S. pneumoniae                          cannot be absolutely ruled out                          since the antigen present                          may be below the detection limit                          of the test.                          Performed at [facility] by  PCR 09/23/2013 NEGATIVE  NEGATIVE Final   Comment:                                 The GeneXpert MRSA Assay (FDA                          approved for NASAL specimens                          only), is one component of a                          comprehensive MRSA colonization                          surveillance program. It is not                          intended to diagnose MRSA                          infection nor to guide or                          monitor treatment for                          MRSA infections.  . Troponin I 09/23/2013 <0.30  <0.30 ng/mL Final   Comment:                                 Due to the release kinetics of cTnI,                          a negative result within the first hours                          of the onset of symptoms does not rule out                          myocardial infarction with certainty.                          If myocardial infarction is still suspected,                          repeat the test at appropriate intervals.  . Troponin I 09/23/2013 <0.30  <0.30 ng/mL Final   Comment:                                 Due to the release kinetics of cTnI,                          a negative result within the first hours                          of the onset of  symptoms does not rule out                          myocardial infarction with certainty.                          If myocardial infarction is still suspected,                          repeat the  test at appropriate intervals.  . Troponin I 09/24/2013 <0.30  <0.30 ng/mL Final   Comment:                                 Due to the release kinetics of cTnI,                          a negative result within the first hours                          of the onset of symptoms does not rule out                          myocardial infarction with certainty.                          If myocardial infarction is still suspected,                          repeat the test at appropriate intervals.  . WBC 09/24/2013 12.1* 4.0 - 10.5 K/uL Final  . RBC 09/24/2013 4.52  3.87 - 5.11 MIL/uL Final  . Hemoglobin 09/24/2013 14.2  12.0 - 15.0 g/dL Final  . HCT 09/24/2013 43.0  36.0 - 46.0 % Final  . MCV 09/24/2013 95.1  78.0 - 100.0 fL Final  . MCH 09/24/2013 31.4  26.0 - 34.0 pg Final  . MCHC 09/24/2013 33.0  30.0 - 36.0 g/dL Final  . RDW 09/24/2013 14.2  11.5 - 15.5 % Final  . Platelets 09/24/2013 426* 150 - 400 K/uL Final  Nursing Home on 07/09/2013  Component Date Value Ref Range Status  . Hemoglobin 05/09/2013 12.8  12.0 - 16.0 g/dL Final  . HCT 05/09/2013 39  36 - 46 % Final  . Neutrophils Absolute 05/09/2013 39   Final  . Platelets 05/09/2013 437* 150 - 399 K/L Final  . WBC 05/09/2013 12.4   Final  . Glucose 05/09/2013 90   Final  . BUN 05/09/2013 14  4 - 21 mg/dL Final  . Creatinine 05/09/2013 0.8  0.5 - 1.1 mg/dL Final  . Potassium 05/09/2013 4.4  3.4 - 5.3 mmol/L Final  . Sodium 05/09/2013 139  137 - 147 mmol/L Final  . Alkaline Phosphatase 05/09/2013 84  25 - 125 U/L Final  . ALT 05/09/2013 13  7 - 35 U/L Final  . AST 05/09/2013 19  13 - 35 U/L Final  . Bilirubin, Total 05/09/2013 0.3   Final  . Hemoglobin A1C 05/09/2013 6.5* 4.0 - 6.0 % Final  . TSH 05/09/2013 4.89  0.41 - 5.90 uIU/mL Final      Assessment/Plan 1. Laceration of forehead, initial encounter Healing. Anticipate suture removal in about a  week  2. History of fall At risk for more fall if she improves enough to  become mobile again  3. Alzheimer's disease Severe. Current condition is worse than her previous status and likely is related to head trauma and decline from that.  4. Dysphagia, oropharyngeal phase Poor intake and at risk for aspiration.  5. Type II or unspecified type diabetes mellitus without mention of complication, not stated as uncontrolled Monitor regularly  6. Unspecified essential hypertension controlled  7. Unspecified hearing loss Impairs communication  8. HCAP (healthcare-associated pneumonia) Although she was discharged in [place], I am not convinced she had a pneumonia. Oral intake is poor, so I have discontinued Levaquin, Flagyl, Remeron, and Valium.

## 2013-11-15 ENCOUNTER — Encounter: Payer: Self-pay | Admitting: Internal Medicine

## 2014-01-14 ENCOUNTER — Non-Acute Institutional Stay: Payer: Medicare Other | Admitting: Adult Health

## 2014-01-14 ENCOUNTER — Encounter: Payer: Self-pay | Admitting: Adult Health

## 2014-01-14 DIAGNOSIS — I10 Essential (primary) hypertension: Secondary | ICD-10-CM

## 2014-01-14 DIAGNOSIS — E119 Type 2 diabetes mellitus without complications: Secondary | ICD-10-CM

## 2014-01-14 DIAGNOSIS — G309 Alzheimer's disease, unspecified: Secondary | ICD-10-CM

## 2014-01-14 DIAGNOSIS — F0280 Dementia in other diseases classified elsewhere without behavioral disturbance: Secondary | ICD-10-CM

## 2014-01-14 DIAGNOSIS — F332 Major depressive disorder, recurrent severe without psychotic features: Secondary | ICD-10-CM

## 2014-01-14 DIAGNOSIS — R0902 Hypoxemia: Secondary | ICD-10-CM

## 2014-01-14 NOTE — Progress Notes (Signed)
Patient ID: [name], female   DOB: 10/25/1915, 80 y.o.   MRN: [id number]  Nursing Home Location:  [facility]   Place of Service: ALF [phone]  Chief Complaint  Patient presents with  . Medical Management of Chronic Issues    HPI:  This is a 80 y.o. female residing at [facility]. I am here to review their chronic medical issues. Her VS have been stable over the past month. Her weight has remained stable at 115 lbs. She is sleeping when I arrive and difficult to arouse. She chronically uses oxygen at 2L [place]. She uses a walker for ambulation. There are no concerns regarding her care today.    Review of Systems:  Review of Systems  Unable to perform ROS: Dementia    Medications: Patient's Medications  New Prescriptions   No medications on file  Previous Medications   ACETAMINOPHEN (TYLENOL) 325 MG TABLET    Take 650 mg by mouth. Take two tablets every 8 hours as needed for pain. Take 2 tablets twice daily as needed for pain   AMLODIPINE (NORVASC) 10 MG TABLET    Take 10 mg by mouth daily. Take 1 tablet daily to treat high blood pressure.   ASPIRIN EC 81 MG TABLET    Take 1 tablet (81 mg total) by mouth daily.   DIAZEPAM (VALIUM) 5 MG TABLET    Take 0.5 tablets (2.5 mg total) by mouth every 6 (six) hours as needed for anxiety.   ENALAPRIL (VASOTEC) 10 MG TABLET    Take 10 mg by mouth daily. Take one daily at 2 pm to control BP.   LACTOSE FREE NUTRITION (BOOST PLUS) LIQD    Take 237 mLs by mouth. One serving once a day   MEMANTINE HCL ER (NAMENDA XR) 14 MG CP24    Take 1 capsule by mouth daily.   PAROXETINE (PAXIL) 40 MG TABLET    Take 40 mg by mouth every morning. Give 1 daily to treat depression/anxiety.   POLYETHYLENE GLYCOL (MIRALAX / GLYCOLAX) PACKET    Take 17 g by mouth daily.   POLYVINYL ALCOHOL (LIQUIFILM TEARS) 1.4 % OPHTHALMIC SOLUTION    1 drop. One drop both eyes twice daily as needed for itchy eyes, every 12 hours as  needed   RIVASTIGMINE (EXELON) 9.5 MG/24HR    Place 1 patch (9.5 mg total) onto the skin daily.   SENNA (SENOKOT) 8.6 MG TABLET    Take 1 tablet by mouth daily. Take 1 every night for laxative - hold for loose stool.   SODIUM CHLORIDE (OCEAN) 0.65 % NASAL SPRAY    Place 1 spray into the nose. Give one spray nasal twice a day  Modified Medications   No medications on file  Discontinued Medications   No medications on file     Physical Exam:  Filed Vitals:   01/14/14 1109  BP: 121/50  Pulse: 66  Temp: 97 F (36.1 C)  Resp: 18  Weight: 115 lb (52.164 kg)  SpO2: 95%   Physical Exam  Constitutional:  Frail, elderly in poor condition.  HENT:  Head: Normocephalic.  Severe hearing loss  Eyes: Conjunctivae are normal. Pupils are equal, round, and reactive to light.  Neck: No JVD present. No tracheal deviation present. No thyromegaly present.  Cardiovascular: Normal rate and regular rhythm.  Exam reveals no friction rub.   No murmur heard. 3-4/ 6 SEM  Pulmonary/Chest: Effort normal and breath sounds normal. No respiratory distress. She  has no wheezes. She has no rales. She exhibits no tenderness.  Abdominal: She exhibits no distension and no mass. There is no tenderness. There is no rebound.  Musculoskeletal: Normal range of motion. She exhibits no edema or tenderness.  Lymphadenopathy:    She has no cervical adenopathy.  Neurological: She is alert. She has normal reflexes. No cranial nerve deficit. Coordination normal.  Non verbal and staring.  Skin: No rash noted. No erythema. No pallor.  3.5 inch laceration of the forehead that has staples.  Psychiatric:  Withdrawn and non verbal    Labs reviewed/Significant Diagnostic Results:  Basic Metabolic Panel:  Recent Labs  16/11/9601/12/15 09/23/13 0042 10/03/13  NA 139 136* 135*  K 4.4 4.3 4.1  CL  --  96  --   CO2  --  23  --   GLUCOSE  --  150*  --   BUN 14 21 8   CREATININE 0.8 0.85 0.5  CALCIUM  --  9.5  --    Liver  Function Tests:  Recent Labs  05/09/13 09/23/13 0042  AST 19 22  ALT 13 15  ALKPHOS 84 127*  BILITOT  --  <0.2*  PROT  --  7.3  ALBUMIN  --  3.6   No results for input(s): LIPASE, AMYLASE in the last 8760 hours. No results for input(s): AMMONIA in the last 8760 hours. CBC:  Recent Labs  05/09/13 09/23/13 0042 09/24/13 0110 10/03/13  WBC 12.4 15.9* 12.1* 13.3  NEUTROABS 39 13.2*  --   --   HGB 12.8 13.2 14.2 10.6*  HCT 39 40.6 43.0 32*  MCV  --  94.0 95.1  --   PLT 437* 457* 426* 595*   CBG: No results for input(s): GLUCAP in the last 8760 hours. TSH:  Recent Labs  05/09/13  TSH 4.89   A1C: Lab Results  Component Value Date   HGBA1C 7.0* 10/03/2013       Assessment/Plan Diabetes Blood sugars range 115-131 in the morning. She is not on meds and is diet controlled. She is frail, thin, and with advanced age. Her goal A1C <8%, and her last A1C was 7% in August. Continue to monitor.  Essential hypertension BP stable on current meds. BMP ok. No signs of fluid overload. Cont current meds and monitor.   Hypoxia She may have COPD given her smoking hx but there are no documents to support this dx. She continues to require oxygen but takes it off frequently. Continue to monitor. If she exhibits any signs of dyspnea would consider scheduled breathing tx. Would not work up further due to frailty and age.  Major depressive disorder, recurrent episode, severe Apparently she has a long standing hx of depression/anxiety. She is tolerating the Paxil and Remeron. I would not change these at this time.  Continue current dose and monitor.  Alzheimer's disease She is incontinent frequently but is able to use the walker for ambulation. Continue Namenda for now as she still has some functionality.     [name] Leyhristy Navneet Schmuck, ANP [facility] [phone]

## 2014-01-16 DIAGNOSIS — R0902 Hypoxemia: Secondary | ICD-10-CM | POA: Insufficient documentation

## 2014-01-16 NOTE — Assessment & Plan Note (Signed)
Apparently she has a long standing hx of depression/anxiety. She is tolerating the Paxil and Remeron. I would not change these at this time.  Continue current dose and monitor.

## 2014-01-16 NOTE — Assessment & Plan Note (Signed)
She may have COPD given her smoking hx but there are no documents to support this dx. She continues to require oxygen but takes it off frequently. Continue to monitor. If she exhibits any signs of dyspnea would consider scheduled breathing tx. Would not work up further due to frailty and age.

## 2014-01-16 NOTE — Assessment & Plan Note (Signed)
Blood sugars range 115-131 in the morning. She is not on meds and is diet controlled. She is frail, thin, and with advanced age. Her goal A1C <8%, and her last A1C was 7% in August. Continue to monitor.

## 2014-01-16 NOTE — Assessment & Plan Note (Signed)
She is incontinent frequently but is able to use the walker for ambulation. Continue Namenda for now as she still has some functionality.

## 2014-01-16 NOTE — Assessment & Plan Note (Signed)
BP stable on current meds. BMP ok. No signs of fluid overload. Cont current meds and monitor.

## 2014-03-20 ENCOUNTER — Non-Acute Institutional Stay (SKILLED_NURSING_FACILITY): Payer: Medicare Other | Admitting: Adult Health

## 2014-03-20 DIAGNOSIS — F332 Major depressive disorder, recurrent severe without psychotic features: Secondary | ICD-10-CM

## 2014-03-20 DIAGNOSIS — I10 Essential (primary) hypertension: Secondary | ICD-10-CM

## 2014-03-20 DIAGNOSIS — G309 Alzheimer's disease, unspecified: Secondary | ICD-10-CM

## 2014-03-20 DIAGNOSIS — K5901 Slow transit constipation: Secondary | ICD-10-CM

## 2014-03-20 DIAGNOSIS — R1312 Dysphagia, oropharyngeal phase: Secondary | ICD-10-CM

## 2014-03-20 DIAGNOSIS — R0902 Hypoxemia: Secondary | ICD-10-CM

## 2014-03-20 DIAGNOSIS — F0280 Dementia in other diseases classified elsewhere without behavioral disturbance: Secondary | ICD-10-CM

## 2014-03-20 DIAGNOSIS — E119 Type 2 diabetes mellitus without complications: Secondary | ICD-10-CM

## 2014-03-20 NOTE — Assessment & Plan Note (Signed)
Stable, continue current regimen 

## 2014-03-20 NOTE — Assessment & Plan Note (Signed)
Stable continue Namenda and Exelon.

## 2014-03-20 NOTE — Assessment & Plan Note (Signed)
Stable on oxygen intermittently, no reports of SOB or cough. Not on meds.

## 2014-03-20 NOTE — Assessment & Plan Note (Signed)
Currently on mech soft, thin liquids. No reports of choking or pna. Weight stable.

## 2014-03-20 NOTE — Assessment & Plan Note (Signed)
BP stable on current meds. Check BMP, CBC next draw.

## 2014-03-20 NOTE — Assessment & Plan Note (Signed)
Stable, not on meds. CBGs 109-138

## 2014-03-20 NOTE — Assessment & Plan Note (Addendum)
Has periods of agitation, using Valium every afternoon and 4 times this month prn.  Also on paxil and remeron. Staff denies change in behavior or episodes of crying. Continue current meds.

## 2014-03-24 ENCOUNTER — Encounter: Payer: Self-pay | Admitting: Adult Health

## 2014-03-24 NOTE — Progress Notes (Signed)
Patient ID: [name], female   DOB: [birth year], 81 y.o.   MRN: [id number]  Nursing Home Location:  [facility]   Place of Service: ALF [phone])  Chief Complaint  Patient presents with  . Medical Management of Chronic Issues    HPI:  This is a 81 y.o. female residing at [facility]. I am here to review their chronic medical issues. She has a hx of HTN, AD, hypoxia, dysphagia, depression, and DM2.  Her VS have been stable over the past month. Her weight has remained stable at 116.4 lbs. She chronically uses oxygen at 2L [place]. She uses a walker for ambulation. There are no concerns regarding her care today.  Review of Systems:  Review of Systems  Unable to perform ROS: Dementia    Medications: Patient's Medications  New Prescriptions   No medications on file  Previous Medications   ACETAMINOPHEN (TYLENOL) 325 MG TABLET    Take 650 mg by mouth every 8 (eight) hours. Take two tablets every 8 hours as needed for pain. Take 2 tablets twice daily as needed for pain   AMLODIPINE (NORVASC) 10 MG TABLET    Take 10 mg by mouth daily. Take 1 tablet daily to treat high blood pressure.   ASPIRIN EC 81 MG TABLET    Take 1 tablet (81 mg total) by mouth daily.   DIAZEPAM (VALIUM) 5 MG TABLET    Take 0.5 tablets (2.5 mg total) by mouth every 6 (six) hours as needed for anxiety.   ENALAPRIL (VASOTEC) 10 MG TABLET    Take 10 mg by mouth daily. Take one daily at 2 pm to control BP.   LACTOSE FREE NUTRITION (BOOST PLUS) LIQD    Take 237 mLs by mouth. One serving once a day   MEMANTINE HCL ER (NAMENDA XR) 14 MG CP24    Take 1 capsule by mouth daily.   MIRTAZAPINE (REMERON) 15 MG TABLET    Take 15 mg by mouth at bedtime.   PAROXETINE (PAXIL) 40 MG TABLET    Take 40 mg by mouth every morning. Give 1 daily to treat depression/anxiety.   POLYETHYLENE GLYCOL (MIRALAX / GLYCOLAX) PACKET    Take 17 g by mouth daily.   POLYVINYL ALCOHOL (LIQUIFILM TEARS) 1.4 %  OPHTHALMIC SOLUTION    1 drop. One drop both eyes twice daily as needed for itchy eyes, every 12 hours as needed   RIVASTIGMINE (EXELON) 9.5 MG/24HR    Place 1 patch (9.5 mg total) onto the skin daily.   SENNA (SENOKOT) 8.6 MG TABLET    Take 1 tablet by mouth daily. Take 1 every night for laxative - hold for loose stool.   SODIUM CHLORIDE (OCEAN) 0.65 % NASAL SPRAY    Place 1 spray into the nose. Give one spray nasal twice a day  Modified Medications   No medications on file  Discontinued Medications   No medications on file     Physical Exam:  Filed Vitals:   03/20/14 1425  BP: 124/82  Pulse: 80  Temp: 96.2 F (35.7 C)  Resp: 20  Weight: 116 lb 6.4 oz (52.799 kg)  SpO2: 95%   Physical Exam  Constitutional:  Frail, elderly in poor condition.  HENT:  Head: Normocephalic.  Severe hearing loss  Eyes: Conjunctivae are normal. Pupils are equal, round, and reactive to light.  Neck: No JVD present. No tracheal deviation present. No thyromegaly present.  Cardiovascular: Normal rate and regular rhythm.  Exam  reveals no friction rub.   No murmur heard. 3-4/ 6 SEM  Pulmonary/Chest: Effort normal and breath sounds normal. No respiratory distress. She has no wheezes. She has no rales. She exhibits no tenderness.  Abdominal: She exhibits no distension and no mass. There is no tenderness. There is no rebound.  Musculoskeletal: Normal range of motion. She exhibits no edema or tenderness.  Lymphadenopathy:    She has no cervical adenopathy.  Neurological: She is alert. No cranial nerve deficit. Coordination normal.  Verbal today but can not answer q's.  Skin: No rash noted. No erythema. No pallor.  Psychiatric:  Flat affect    Labs reviewed/Significant Diagnostic Results:  Basic Metabolic Panel:  Recent Labs  11/91/4702/02/11 09/23/13 0042 10/03/13  NA 139 136* 135*  K 4.4 4.3 4.1  CL  --  96  --   CO2  --  23  --   GLUCOSE  --  150*  --   BUN 14 21 8   CREATININE 0.8 0.85 0.5    CALCIUM  --  9.5  --    Liver Function Tests:  Recent Labs  05/09/13 09/23/13 0042  AST 19 22  ALT 13 15  ALKPHOS 84 127*  BILITOT  --  <0.2*  PROT  --  7.3  ALBUMIN  --  3.6   No results for input(s): LIPASE, AMYLASE in the last 8760 hours. No results for input(s): AMMONIA in the last 8760 hours. CBC:  Recent Labs  05/09/13 09/23/13 0042 09/24/13 0110 10/03/13  WBC 12.4 15.9* 12.1* 13.3  NEUTROABS 39 13.2*  --   --   HGB 12.8 13.2 14.2 10.6*  HCT 39 40.6 43.0 32*  MCV  --  94.0 95.1  --   PLT 437* 457* 426* 595*   CBG: No results for input(s): GLUCAP in the last 8760 hours. TSH:  Recent Labs  05/09/13  TSH 4.89   A1C: Lab Results  Component Value Date   HGBA1C 7.0* 10/03/2013       Assessment/Plan Alzheimer's disease Stable continue Namenda and Exelon.    Diabetes Stable, not on meds. CBGs 109-138   Major depressive disorder, recurrent episode, severe Has periods of agitation, using Valium every afternoon and 4 times this month prn.  Also on paxil and remeron. Staff denies change in behavior or episodes of crying. Continue current meds.   Constipation Stable, continue current regimen   Hypoxia Stable on oxygen intermittently, no reports of SOB or cough. Not on meds.   Dysphagia, oropharyngeal phase Currently on mech soft, thin liquids. No reports of choking or pna. Weight stable.   Essential hypertension BP stable on current meds. Check BMP, CBC next draw.   LABS: CBC, CMP, TSH, Vit D  [name] Leyhristy Corley Kohls, ANP [facility] [phone]

## 2014-03-25 LAB — TSH: TSH: 2.17 u[IU]/mL (ref <=5.90)

## 2014-04-01 LAB — BASIC METABOLIC PANEL
BUN: 19 mg/dL (ref 4–21)
CREATININE: 0.8 mg/dL (ref 0.5–1.1)
Glucose: 116 mg/dL
Potassium: 4.9 mmol/L (ref 3.4–5.3)
SODIUM: 140 mmol/L (ref 137–147)

## 2014-04-01 LAB — CBC AND DIFFERENTIAL
PLATELETS: 534 10*3/uL — AB (ref 150–399)
WBC: 11.4 10^3/mL

## 2014-04-08 LAB — CBC AND DIFFERENTIAL
HCT: 39 % (ref 36–46)
HEMOGLOBIN: 13.1 g/dL (ref 12.0–16.0)
PLATELETS: 498 10*3/uL — AB (ref 150–399)
WBC: 13.2 10*3/mL

## 2014-04-09 ENCOUNTER — Non-Acute Institutional Stay (SKILLED_NURSING_FACILITY): Payer: Medicare Other | Admitting: Internal Medicine

## 2014-04-09 DIAGNOSIS — G309 Alzheimer's disease, unspecified: Secondary | ICD-10-CM

## 2014-04-09 DIAGNOSIS — R0902 Hypoxemia: Secondary | ICD-10-CM

## 2014-04-09 DIAGNOSIS — D72829 Elevated white blood cell count, unspecified: Secondary | ICD-10-CM

## 2014-04-09 DIAGNOSIS — F0280 Dementia in other diseases classified elsewhere without behavioral disturbance: Secondary | ICD-10-CM

## 2014-04-10 NOTE — Progress Notes (Signed)
Patient ID: Minerva FesterKatherine H Aarons, female   DOB: 12/15/1915, 81 y.o.   MRN: [id number]  Location: [facility] Provider:  Jaquesha Boroff L. Renato Gailseed, D.O., C.M.D.   Chief Complaint  Patient presents with  . Acute Visit    increased leukocytosis, continued functional decline    HPI:  81 yo white female long term care resident in memory care was seen due to increased leukocytosis and continued functional decline from her dementia.  She is dependent in adls except feeding at this time.  She wears O2 chronically.  WBC was 11.4 04/01/14, now 13.2 04/08/14.  She also has thrombocytosis.  No known aspiration.  Review of Systems: pt did not speak to me Review of Systems  Constitutional: Positive for malaise/fatigue. Negative for fever and chills.  Neurological: Positive for weakness.  Psychiatric/Behavioral: Positive for memory loss.    Medications: Patient's Medications  New Prescriptions   No medications on file  Previous Medications   ACETAMINOPHEN (TYLENOL) 325 MG TABLET    Take 650 mg by mouth every 8 (eight) hours. Take two tablets every 8 hours as needed for pain. Take 2 tablets twice daily as needed for pain   AMLODIPINE (NORVASC) 10 MG TABLET    Take 10 mg by mouth daily. Take 1 tablet daily to treat high blood pressure.   ASPIRIN EC 81 MG TABLET    Take 1 tablet (81 mg total) by mouth daily.   DIAZEPAM (VALIUM) 5 MG TABLET    Take 0.5 tablets (2.5 mg total) by mouth every 6 (six) hours as needed for anxiety.   ENALAPRIL (VASOTEC) 10 MG TABLET    Take 10 mg by mouth daily. Take one daily at 2 pm to control BP.   LACTOSE FREE NUTRITION (BOOST PLUS) LIQD    Take 237 mLs by mouth. One serving once a day   MEMANTINE HCL ER (NAMENDA XR) 14 MG CP24    Take 1 capsule by mouth daily.   MIRTAZAPINE (REMERON) 15 MG TABLET    Take 15 mg by mouth at bedtime.   PAROXETINE (PAXIL) 40 MG TABLET    Take 40 mg by mouth every morning. Give 1 daily to treat depression/anxiety.   POLYETHYLENE GLYCOL  (MIRALAX / GLYCOLAX) PACKET    Take 17 g by mouth daily.   POLYVINYL ALCOHOL (LIQUIFILM TEARS) 1.4 % OPHTHALMIC SOLUTION    1 drop. One drop both eyes twice daily as needed for itchy eyes, every 12 hours as needed   RIVASTIGMINE (EXELON) 9.5 MG/24HR    Place 1 patch (9.5 mg total) onto the skin daily.   SENNA (SENOKOT) 8.6 MG TABLET    Take 1 tablet by mouth daily. Take 1 every night for laxative - hold for loose stool.   SODIUM CHLORIDE (OCEAN) 0.65 % NASAL SPRAY    Place 1 spray into the nose. Give one spray nasal twice a day  Modified Medications   No medications on file  Discontinued Medications   No medications on file    Physical Exam: There were no vitals filed for this visit. Physical Exam  Constitutional: She is oriented to person, place, and time. No distress.  Resting in bed, opens eyes, but does not otherwise respond to me  Cardiovascular: Normal rate, regular rhythm, normal heart sounds and intact distal pulses.   Pulmonary/Chest: Effort normal and breath sounds normal. No respiratory distress.  Wearing O2  Abdominal: Soft. Bowel sounds are normal.  Neurological: She is alert and oriented to person, place,  and time.    Labs reviewed: Basic Metabolic Panel:  Recent Labs  78/29/56 09/23/13 0042 10/03/13  NA 139 136* 135*  K 4.4 4.3 4.1  CL  --  96  --   CO2  --  23  --   GLUCOSE  --  150*  --   BUN CREATININE 0.8 0.85 0.5  CALCIUM  --  9.5  --     Liver Function Tests:  Recent Labs  05/09/13 09/23/13 0042  AST 19 22  ALT 13 15  ALKPHOS 84 127*  BILITOT  --  <0.2*  PROT  --  7.3  ALBUMIN  --  3.6    CBC:  Recent Labs  05/09/13  09/23/13 0042 09/24/13 0110 10/03/13 04/01/14 04/08/14  WBC 12.4  < > 15.9* 12.1* 13.3 11.4 13.2  NEUTROABS 39  --  13.2*  --   --   --   --   HGB 12.8  --  13.2 14.2 10.6*  --  13.1  HCT 39  --  40.6 43.0 32*  --  39  MCV  --   --  94.0 95.1  --   --   --   PLT 437*  --  457* 426* 595* 534* 498*  < > = values  in this interval not displayed.  Assessment/Plan 1. Leukocytosis -Recheck cbc with diff next Tuesday -Check peripheral smear, also due to leukocytosis and thrombocytosis -another possibility is recurrent aspiration  2. Hypoxia -cont oxygen therapy -?etiology of her need for O2 -she is a prior smoker  3. Alzheimer's disease -cont exelon patch and namenda XR  Family/ staff Communication: discussed with nurse   Labs/tests ordered:  F/u peripheral smear and cbc 2/16

## 2014-04-14 ENCOUNTER — Encounter: Payer: Self-pay | Admitting: Internal Medicine

## 2014-06-05 ENCOUNTER — Encounter: Payer: Self-pay | Admitting: Adult Health

## 2014-06-05 ENCOUNTER — Non-Acute Institutional Stay: Payer: Medicare Other | Admitting: Adult Health

## 2014-06-05 DIAGNOSIS — E119 Type 2 diabetes mellitus without complications: Secondary | ICD-10-CM | POA: Diagnosis not present

## 2014-06-05 DIAGNOSIS — I10 Essential (primary) hypertension: Secondary | ICD-10-CM | POA: Diagnosis not present

## 2014-06-05 DIAGNOSIS — F332 Major depressive disorder, recurrent severe without psychotic features: Secondary | ICD-10-CM

## 2014-06-05 DIAGNOSIS — F0280 Dementia in other diseases classified elsewhere without behavioral disturbance: Secondary | ICD-10-CM

## 2014-06-05 DIAGNOSIS — G309 Alzheimer's disease, unspecified: Secondary | ICD-10-CM | POA: Diagnosis not present

## 2014-06-05 DIAGNOSIS — R0902 Hypoxemia: Secondary | ICD-10-CM | POA: Diagnosis not present

## 2014-06-05 NOTE — Progress Notes (Signed)
Patient ID: [name], female   DOB: 08/04/1915, 81 y.o.   MRN: [id number]    [facility]urs[facility]   Place of Service: ALF [phone])  Chief Complaint  Patient presents with  . Medical Management of Chronic Issues    HPI:  This is a 81 y.o. female [facility]. I am here to review their chronic medical issues. She has a hx of HTN, AD, hypoxia, dysphagia, depression, and DM2.  Her VS have been stable over the past month. . She intermittently uses oxygen at 2L [place]. She uses a walker for ambulation. She continues to have periods of agitation and received prn valium with relief on 06/03/14.  She is a full code and apparently this has [facility] and they would like to keep her a full code. She has a hx of Type 2 DM and her CBGs have ranged 118-134, without meds.   Review of Systems:  Review of Systems  Unable to perform ROS: Dementia    Medications: Patient's Medications  New Prescriptions   No medications on file  Previous Medications   ACETAMINOPHEN (TYLENOL) 325 MG TABLET    Take 650 mg by mouth every 8 (eight) hours. Take two tablets every 8 hours as needed for pain. Take 2 tablets twice daily as needed for pain   AMLODIPINE (NORVASC) 10 MG TABLET    Take 10 mg by mouth daily. Take 1 tablet daily to treat high blood pressure.   ASPIRIN EC 81 MG TABLET    Take 1 tablet (81 mg total) by mouth daily.   DIAZEPAM (VALIUM) 5 MG TABLET    Take 0.5 tablets (2.5 mg total) by mouth every 6 (six) hours as needed for anxiety.   ENALAPRIL (VASOTEC) 10 MG TABLET    Take 10 mg by mouth daily. Take one daily at 2 pm to control BP.   LACTOSE FREE NUTRITION (BOOST PLUS) LIQD    Take 237 mLs by mouth. One serving once a day   MEMANTINE HCL ER (NAMENDA XR) 14 MG CP24    Take 1 capsule by mouth daily.   MIRTAZAPINE (REMERON) 15 MG TABLET    Take 15 mg by mouth at bedtime.   PAROXETINE (PAXIL) 40 MG  TABLET    Take 40 mg by mouth every morning. Give 1 daily to treat depression/anxiety.   POLYETHYLENE GLYCOL (MIRALAX / GLYCOLAX) PACKET    Take 17 g by mouth daily.   POLYVINYL ALCOHOL (LIQUIFILM TEARS) 1.4 % OPHTHALMIC SOLUTION    1 drop. One drop both eyes twice daily as needed for itchy eyes, every 12 hours as needed   RIVASTIGMINE (EXELON) 9.5 MG/24HR    Place 1 patch (9.5 mg total) onto the skin daily.   SENNA (SENOKOT) 8.6 MG TABLET    Take 1 tablet by mouth daily. Take 1 every night for laxative - hold for loose stool.   SODIUM CHLORIDE (OCEAN) 0.65 % NASAL SPRAY    Place 1 spray into the nose. Give one spray nasal twice a day  Modified Medications   No medications on file  Discontinued Medications   No medications on file     Physical Exam:  Filed Vitals:   06/05/14 1612  BP: 128/84  Pulse: 78  Temp: 97.8 F (36.6 C)  Resp: 16  Weight: 122 lb (55.339 kg)  SpO2: 96%   Physical Exam  Constitutional:  Frail, elderly in  poor condition.  HENT:  Head: Normocephalic.  Severe hearing loss  Eyes: Conjunctivae are normal. Pupils are equal, round, and reactive to light.  Neck: No JVD present. No tracheal deviation present. No thyromegaly present.  Cardiovascular: Normal rate and regular rhythm.  Exam reveals no friction rub.   No murmur heard. 3/ 6 SEM  Pulmonary/Chest: Effort normal and breath sounds normal. No respiratory distress. She has no wheezes. She has no rales. She exhibits no tenderness.  Abdominal: She exhibits no distension and no mass. There is no tenderness. There is no rebound.  Musculoskeletal: Normal range of motion. She exhibits no edema or tenderness.  Lymphadenopathy:    She has no cervical adenopathy.  Neurological: She is alert. No cranial nerve deficit. Coordination normal.  Verbal, able to follow commands with coaching, intermittently  Skin: No rash noted. No erythema. No pallor.  Psychiatric:  Flat affect    Labs reviewed/Significant Diagnostic  Results:  Basic Metabolic Panel:  Recent Labs  56/21/30 0042 10/03/13 04/01/14  NA 136* 135* 140  K 4.3 4.1 4.9  CL 96  --   --   CO2 23  --   --   GLUCOSE 150*  --   --   BUN CREATININE 0.85 0.5 0.8  CALCIUM 9.5  --   --    Liver Function Tests:  Recent Labs  09/23/13 0042  AST 22  ALT 15  ALKPHOS 127*  BILITOT <0.2*  PROT 7.3  ALBUMIN 3.6   No results for input(s): LIPASE, AMYLASE in the last 8760 hours. No results for input(s): AMMONIA in the last 8760 hours. CBC:  Recent Labs  09/23/13 0042 09/24/13 0110 10/03/13 04/01/14 04/08/14  WBC 15.9* 12.1* 13.3 11.4 13.2  NEUTROABS 13.2*  --   --   --   --   HGB 13.2 14.2 10.6*  --  13.1  HCT 40.6 43.0 32*  --  39  MCV 94.0 95.1  --   --   --   PLT 457* 426* 595* 534* 498*   CBG: No results for input(s): GLUCAP in the last 8760 hours. TSH:  Recent Labs  03/25/14  TSH 2.17   A1C: Lab Results  Component Value Date   HGBA1C 7.0* 10/03/2013   03/25/14: Vit D 32 04/15/14: PCXR Interstitial markings, likely peribronchial wall thickening due to chronic inflammation or interstitial lung disease  Assessment/Plan  1. Alzheimer's disease Stable with periods of agitation. Continue Exelon and Namenda.   2. Major depressive disorder, recurrent, severe without psychotic features Continue current medications due to periods of anxiety   3. Diabetes type 2, controlled Controlled by diet  4. Hypoxia Continue to use prn. No SOB, cough or wheezing. Questionable etiology? Xray indicates possible ILD, also previous smoker  5. Essential hypertension Stable off Vasotec due to hypokalemia. Continue Norvasc and monitor BMP.   6. Chronic leukocytosis Unclear etiology. Peripheral smear showed reactive thrombocytosis and granulocytosis. No further work up due to age.     [name] Ley, ANP [facility] [phone]

## 2014-08-08 ENCOUNTER — Non-Acute Institutional Stay: Payer: Medicare Other | Admitting: Adult Health

## 2014-08-08 ENCOUNTER — Encounter: Payer: Self-pay | Admitting: Adult Health

## 2014-08-08 DIAGNOSIS — K5901 Slow transit constipation: Secondary | ICD-10-CM

## 2014-08-08 DIAGNOSIS — E119 Type 2 diabetes mellitus without complications: Secondary | ICD-10-CM

## 2014-08-08 DIAGNOSIS — R0902 Hypoxemia: Secondary | ICD-10-CM | POA: Diagnosis not present

## 2014-08-08 DIAGNOSIS — G309 Alzheimer's disease, unspecified: Secondary | ICD-10-CM

## 2014-08-08 DIAGNOSIS — I10 Essential (primary) hypertension: Secondary | ICD-10-CM

## 2014-08-08 DIAGNOSIS — F0280 Dementia in other diseases classified elsewhere without behavioral disturbance: Secondary | ICD-10-CM

## 2014-08-08 NOTE — Progress Notes (Signed)
Patient ID: [name], female   DOB: Nov 07, 1915, 81 y.o.   MRN: [id number]    Nursing Home Location:  [facility]  Patient Care Team: Kermit Balo, DO as PCP - General (Geriatric Medicine) [facility]  Place of Service: ALF [phone])  Chief Complaint  Patient presents with  . Medical Management of Chronic Issues    HPI:  This is a 81 y.o. female residing at [facility]. I am here to review their chronic medical issues. She has a hx of HTN, AD, hypoxia, dysphagia, depression, and DM2. She intermittently uses oxygen at 2L [place]. She uses a walker for ambulation. She continues to have periods of agitation and frequent falls. She is redirected by the staff and has an order for prn valium.   Her weight has remained stable at 121.5 lbs.    Review of Systems:  Review of Systems  Unable to perform ROS: Dementia    Medications: Patient's Medications  New Prescriptions   No medications on file  Previous Medications   ACETAMINOPHEN (TYLENOL) 325 MG TABLET    Take 650 mg by mouth every 8 (eight) hours. Take two tablets every 8 hours as needed for pain. Take 2 tablets twice daily as needed for pain   AMLODIPINE (NORVASC) 10 MG TABLET    Take 10 mg by mouth daily. Take 1 tablet daily to treat high blood pressure.   ASPIRIN EC 81 MG TABLET    Take 1 tablet (81 mg total) by mouth daily.   DIAZEPAM (VALIUM) 5 MG TABLET    Take 0.5 tablets (2.5 mg total) by mouth every 6 (six) hours as needed for anxiety.   LACTOSE FREE NUTRITION (BOOST PLUS) LIQD    Take 237 mLs by mouth. One serving once a day   MEMANTINE HCL ER (NAMENDA XR) 14 MG CP24    Take 1 capsule by mouth daily.   MIRTAZAPINE (REMERON) 15 MG TABLET    Take 15 mg by mouth at bedtime.   PAROXETINE (PAXIL) 40 MG TABLET    Take 40 mg by mouth every morning. Give 1 daily to treat depression/anxiety.   POLYETHYLENE GLYCOL (MIRALAX / GLYCOLAX) PACKET    Take 17 g by mouth daily.   POLYVINYL ALCOHOL (LIQUIFILM TEARS) 1.4 % OPHTHALMIC SOLUTION    1 drop. One drop both eyes twice daily as needed for itchy eyes, every 12 hours as needed   RIVASTIGMINE (EXELON) 9.5 MG/24HR    Place 1 patch (9.5 mg total) onto the skin daily.   SENNA (SENOKOT) 8.6 MG TABLET    Take 1 tablet by mouth daily. Take 1 every night for laxative - hold for loose stool.   SODIUM CHLORIDE (OCEAN) 0.65 % NASAL SPRAY    Place 1 spray into the nose. Give one spray nasal twice a day  Modified Medications   No medications on file  Discontinued Medications   No medications on file     Physical Exam:  Filed Vitals:   08/08/14 1218  BP: 126/67  Pulse: 60  Temp: 97.7 F (36.5 C)  Resp: 20  Weight: 121 lb 8 oz (55.112 kg)  SpO2: 92%   Wt Readings from Last 3 Encounters:  08/08/14 121 lb 8 oz (55.112 kg)  06/05/14 122 lb (55.339 kg)  04/09/14 118 lb 12.8 oz (53.887 kg)    Physical Exam  Constitutional:  Frail, elderly in poor condition.  HENT:  Head: Normocephalic.  Severe hearing loss  Eyes: Conjunctivae  are normal. Pupils are equal, round, and reactive to light.  Neck: No JVD present. No tracheal deviation present. No thyromegaly present.  Cardiovascular: Normal rate and regular rhythm.  Exam reveals no friction rub.   No murmur heard. 3/ 6 SEM  Pulmonary/Chest: Effort normal and breath sounds normal. No respiratory distress. She has no wheezes. She has no rales. She exhibits no tenderness.  Abdominal: She exhibits no distension and no mass. There is no tenderness. There is no rebound.  Musculoskeletal: Normal range of motion. She exhibits no edema or tenderness.  Lymphadenopathy:    She has no cervical adenopathy.  Neurological: She is alert. No cranial nerve deficit. Coordination normal.  Verbal, able to follow commands with coaching, intermittently  Skin: No rash noted. No erythema. No pallor.  Psychiatric:  Flat affect    Labs reviewed/Significant Diagnostic Results:  Basic  Metabolic Panel:  Recent Labs  16/10/96 0042 10/03/13 04/01/14  NA 136* 135* 140  K 4.3 4.1 4.9  CL 96  --   --   CO2 23  --   --   GLUCOSE 150*  --   --   BUN CREATININE 0.85 0.5 0.8  CALCIUM 9.5  --   --    Liver Function Tests:  Recent Labs  09/23/13 0042  AST 22  ALT 15  ALKPHOS 127*  BILITOT <0.2*  PROT 7.3  ALBUMIN 3.6   No results for input(s): LIPASE, AMYLASE in the last 8760 hours. No results for input(s): AMMONIA in the last 8760 hours. CBC:  Recent Labs  09/23/13 0042 09/24/13 0110 10/03/13 04/01/14 04/08/14  WBC 15.9* 12.1* 13.3 11.4 13.2  NEUTROABS 13.2*  --   --   --   --   HGB 13.2 14.2 10.6*  --  13.1  HCT 40.6 43.0 32*  --  39  MCV 94.0 95.1  --   --   --   PLT 457* 426* 595* 534* 498*   CBG: No results for input(s): GLUCAP in the last 8760 hours. TSH:  Recent Labs  03/25/14  TSH 2.17   A1C: Lab Results  Component Value Date   HGBA1C 7.0* 10/03/2013   03/25/14: Vit D 32 04/15/14: PCXR Interstitial markings, likely peribronchial wall thickening due to chronic inflammation or interstitial lung disease  Assessment/Plan  1. Alzheimer's disease Stable, continue current meds. She has maintained some functional capability and therefore is benefiting from namenda and exelon  2. Diabetes type 2, controlled CBG stable with diet control only  3. Essential hypertension Stable on current meds  4. Hypoxia Stable on 2l of oxygen. H/O smoking with no PFTs for review  5. Constipation Stable on current meds     [name] Ley, ANP [facility] [phone]

## 2014-08-12 LAB — HEPATIC FUNCTION PANEL
ALT: 11 U/L (ref 7–35)
AST: 19 U/L (ref 13–35)
Alkaline Phosphatase: 106 U/L (ref 25–125)
Bilirubin, Total: 0.3 mg/dL

## 2014-08-12 LAB — CBC AND DIFFERENTIAL
HEMATOCRIT: 40 % (ref 36–46)
Hemoglobin: 13.3 g/dL (ref 12.0–16.0)
PLATELETS: 485 10*3/uL — AB (ref 150–399)
WBC: 10 10*3/mL

## 2014-08-12 LAB — BASIC METABOLIC PANEL
BUN: 16 mg/dL (ref 4–21)
Creatinine: 0.8 mg/dL (ref 0.5–1.1)
Glucose: 102 mg/dL
Potassium: 4.6 mmol/L (ref 3.4–5.3)
SODIUM: 141 mmol/L (ref 137–147)

## 2014-09-18 ENCOUNTER — Encounter: Payer: Self-pay | Admitting: Adult Health

## 2014-09-18 ENCOUNTER — Non-Acute Institutional Stay (SKILLED_NURSING_FACILITY): Payer: Medicare Other | Admitting: Adult Health

## 2014-09-18 DIAGNOSIS — E119 Type 2 diabetes mellitus without complications: Secondary | ICD-10-CM | POA: Diagnosis not present

## 2014-09-18 DIAGNOSIS — G309 Alzheimer's disease, unspecified: Secondary | ICD-10-CM | POA: Diagnosis not present

## 2014-09-18 DIAGNOSIS — F0280 Dementia in other diseases classified elsewhere without behavioral disturbance: Secondary | ICD-10-CM

## 2014-09-18 DIAGNOSIS — D72829 Elevated white blood cell count, unspecified: Secondary | ICD-10-CM | POA: Diagnosis not present

## 2014-09-18 DIAGNOSIS — I10 Essential (primary) hypertension: Secondary | ICD-10-CM | POA: Diagnosis not present

## 2014-09-18 NOTE — Progress Notes (Signed)
Patient ID: [name], female   DOB: [birth year], 81 y.o.   MRN: [id number]    Nursing Home Location:  [facility]  Patient Care Team: Kermit Balo, DO as PCP - General (Geriatric Medicine) [facility]  Place of Service: SNF [phone])  Chief Complaint  Patient presents with  . Medical Management of Chronic Issues    HPI:  This is a 81 y.o. female residing at [facility]. I am here to review their chronic medical issues. She has a hx of HTN, AD, hypoxia, dysphagia, depression, and DM2. She intermittently uses oxygen at 2L [place]. There are no complaints regarding her care.  She has advanced AD and is not able to perform for an MMSE. Her BP has been variable ranging 93-180/55-85.   She is a full code and I am told by the staff that this issue has been discussed before and they are adamant that she remain a full code despite her age and debility.   Functional status: Walker for ambulation.    Review of Systems:  Review of Systems  Unable to perform ROS: Dementia    Medications: Patient's Medications  New Prescriptions   No medications on file  Previous Medications   AMLODIPINE (NORVASC) 10 MG TABLET    Take 10 mg by mouth daily. Take 1 tablet daily to treat high blood pressure.   ASPIRIN EC 81 MG TABLET    Take 1 tablet (81 mg total) by mouth daily.   DIAZEPAM (VALIUM) 5 MG TABLET    Take 0.5 tablets (2.5 mg total) by mouth every 6 (six) hours as needed for anxiety.   LACTOSE FREE NUTRITION (BOOST PLUS) LIQD    Take 237 mLs by mouth. One serving once a day   MEMANTINE HCL ER (NAMENDA XR) 14 MG CP24    Take 1 capsule by mouth daily.   MIRTAZAPINE (REMERON) 15 MG TABLET    Take 15 mg by mouth at bedtime.   PAROXETINE (PAXIL) 40 MG TABLET    Take 40 mg by mouth every morning. Give 1 daily to treat depression/anxiety.   POLYETHYLENE GLYCOL (MIRALAX / GLYCOLAX) PACKET    Take 17 g by mouth daily.   POLYVINYL ALCOHOL  (LIQUIFILM TEARS) 1.4 % OPHTHALMIC SOLUTION    1 drop. One drop both eyes twice daily as needed for itchy eyes, every 12 hours as needed   RIVASTIGMINE (EXELON) 9.5 MG/24HR    Place 1 patch (9.5 mg total) onto the skin daily.   SENNA (SENOKOT) 8.6 MG TABLET    Take 1 tablet by mouth daily. Take 1 every night for laxative - hold for loose stool.   SODIUM CHLORIDE (OCEAN) 0.65 % NASAL SPRAY    Place 1 spray into the nose. Give one spray nasal twice a day  Modified Medications   No medications on file  Discontinued Medications   ACETAMINOPHEN (TYLENOL) 325 MG TABLET    Take 650 mg by mouth every 8 (eight) hours. Take two tablets every 8 hours as needed for pain. Take 2 tablets twice daily as needed for pain     Physical Exam:  Filed Vitals:   09/18/14 1422  BP: 130/85  Pulse: 58  Temp: 97 F (36.1 C)  Resp: 20  Weight: 121 lb 8 oz (55.112 kg)  SpO2: 95%   Wt Readings from Last 3 Encounters:  09/18/14 121 lb 8 oz (55.112 kg)  08/08/14 121 lb 8 oz (55.112 kg)  06/05/14 122 lb (  55.339 kg)    Physical Exam  Constitutional:  Frail, elderly in poor condition.  HENT:  Head: Normocephalic.  Severe hearing loss  Eyes: Conjunctivae are normal. Pupils are equal, round, and reactive to light.  Neck: No JVD present. No tracheal deviation present. No thyromegaly present.  Cardiovascular: Normal rate and regular rhythm.  Exam reveals no friction rub.   No murmur heard. 3/ 6 SEM  Pulmonary/Chest: Effort normal and breath sounds normal. No respiratory distress. She has no wheezes. She has no rales. She exhibits no tenderness.  Abdominal: She exhibits no distension and no mass. There is no tenderness. There is no rebound.  Musculoskeletal: Normal range of motion. She exhibits no edema or tenderness.  Lymphadenopathy:    She has no cervical adenopathy.  Neurological: She is alert. No cranial nerve deficit. Coordination normal.  Verbal, able to follow commands with coaching, intermittently    Skin: No rash noted. No erythema. No pallor.  Psychiatric:  Flat affect    Labs reviewed/Significant Diagnostic Results:  Basic Metabolic Panel:  Recent Labs  40/98/11 0042 10/03/13 04/01/14 08/12/14  NA 136* 135* 140 141  K 4.3 4.1 4.9 4.6  CL 96  --   --   --   CO2 23  --   --   --   GLUCOSE 150*  --   --   --   BUN CREATININE 0.85 0.5 0.8 0.8  CALCIUM 9.5  --   --   --    Liver Function Tests:  Recent Labs  09/23/13 0042 08/12/14  AST 22 19  ALT 15 11  ALKPHOS 127* 106  BILITOT <0.2*  --   PROT 7.3  --   ALBUMIN 3.6  --    No results for input(s): LIPASE, AMYLASE in the last 8760 hours. No results for input(s): AMMONIA in the last 8760 hours. CBC:  Recent Labs  09/23/13 0042 09/24/13 0110  10/03/13 04/01/14 04/08/14 08/12/14  WBC 15.9* 12.1*  < > 13.3 11.4 13.2 10.0  NEUTROABS 13.2*  --   --   --   --   --   --   HGB 13.2 14.2  --  10.6*  --  13.1 13.3  HCT 40.6 43.0  --  32*  --  39 40  MCV 94.0 95.1  --   --   --   --   --   PLT 457* 426*  --  595* 534* 498* 485*  < > = values in this interval not displayed. CBG: No results for input(s): GLUCAP in the last 8760 hours. TSH:  Recent Labs  03/25/14  TSH 2.17   A1C: Lab Results  Component Value Date   HGBA1C 7.0* 10/03/2013   03/25/14: Vit D 32 04/15/14: PCXR Interstitial markings, likely peribronchial wall thickening due to chronic inflammation or interstitial lung disease  Assessment/Plan  1. Alzheimer's disease Stable, continue current meds. She has maintained some functional capability and therefore is benefiting from namenda and exelon  2. Diabetes type 2, controlled CBG stable with diet control only  3. Essential hypertension Variable numbers may be due to electric cuff and agitation.  Continue current regimen, no aggressive therapy due to fall risk  4. Hypoxia Stable on 2l of oxygen. H/O smoking with no PFTs for review. No dyspnea, cough or wheezing.  5.  Constipation Stable on current meds  6. Dysphagia Continue mech soft diet with thin liquids, no reports of coughing with meals and no  bouts of aspiration  7. Leukocytosis/Thrombocytosis -Improved WBC 10 on 08/12/14, plt improved 485 as well. Continue to monitor, no work up due to age, debility     [name] Ley, ANP [facility] [phone]

## 2014-09-23 ENCOUNTER — Encounter: Payer: Self-pay | Admitting: Internal Medicine

## 2014-09-23 ENCOUNTER — Non-Acute Institutional Stay (SKILLED_NURSING_FACILITY): Payer: Medicare Other | Admitting: Internal Medicine

## 2014-09-23 DIAGNOSIS — F918 Other conduct disorders: Secondary | ICD-10-CM | POA: Diagnosis not present

## 2014-09-23 DIAGNOSIS — R195 Other fecal abnormalities: Secondary | ICD-10-CM | POA: Diagnosis not present

## 2014-09-23 DIAGNOSIS — R4689 Other symptoms and signs involving appearance and behavior: Secondary | ICD-10-CM

## 2014-09-23 DIAGNOSIS — G309 Alzheimer's disease, unspecified: Secondary | ICD-10-CM

## 2014-09-23 DIAGNOSIS — E119 Type 2 diabetes mellitus without complications: Secondary | ICD-10-CM | POA: Diagnosis not present

## 2014-09-23 DIAGNOSIS — F0280 Dementia in other diseases classified elsewhere without behavioral disturbance: Secondary | ICD-10-CM

## 2014-09-23 MED ORDER — RIVASTIGMINE 9.5 MG/24HR TD PT24: 9.5000 mg | MEDICATED_PATCH | Freq: Every day | TRANSDERMAL | Status: AC

## 2014-09-23 NOTE — Progress Notes (Signed)
Patient ID: DONNAH LEVERT, female   DOB: Dec 09, 1915, 81 y.o.   MRN: [id number]  Location:  [facility] Provider:  Sylas Twombly L. Renato Gails, D.O., C.M.D.  Code Status:  Full code Goals of care: Advanced Directive information Does patient have an advance directive?: Yes, Type of Advance Directive: Healthcare Power of [place];Living will, Does patient want to make changes to advanced directive?: No - Patient declinedneed to have discussion with her HCPOA about code status  Chief Complaint  Patient presents with  . Acute Visit    sexually inappropriate comments in dining area and social area in memory care unit    HPI:  81 yo white female with h/o late stage dementia, COPD on oxygen, DMII, dysphagia, depression was seen for an acute visit due to nursing supervisor mentioning that she is having new behavior--saying inappropriate things in the dining area and activity area of the memory care unit such as "I need someone to fuck me" and "I need someone to suck my pussy."  It is noted that her daughter, Joyce Gross is out of town for a long trip and she's historically "acted out" when she's away.  Her po intake is down and she's had some difficulty with incontinence of loose stool (at least 3 episodes in the past two weeks).  She is on miralax daily at present.  She denied abdominal pain, but I could not understand most of her responses today.  Review of Systems:  Review of Systems  Constitutional: Positive for malaise/fatigue. Negative for fever and chills.  HENT:       Dysphagia  Respiratory: Negative for cough and shortness of breath.        On oxygen  Cardiovascular: Negative for chest pain.  Gastrointestinal: Positive for diarrhea.       Fecal incontinence  Genitourinary: Negative for dysuria.  Musculoskeletal: Negative for falls.  Skin: Negative for rash.  Neurological: Positive for weakness.  Endo/Heme/Allergies: Bruises/bleeds easily.       Diabetes  Psychiatric/Behavioral:  Positive for depression and memory loss.    Past Medical History  Diagnosis Date  . Encounter for long-term (current) use of other medications 09/06/2011  . Closed fracture of other bone of wrist 03/15/2011  . Retention of urine, unspecified 02/24/2011  . Dysphagia, oropharyngeal phase 04/09/2010  . Pneumonitis due to inhalation of food or vomitus 04/06/2010  . Abnormality of gait 08/24/2009  . Iron deficiency anemia, unspecified 07/29/2009  . Personal history of fall 07/29/2009  . Pain in joint, lower leg 06/17/2009  . Type II or unspecified type diabetes mellitus without mention of complication, not stated as uncontrolled 08/18/2008  . Rickets, active 02/08/2008  . Allergic rhinitis due to pollen 07/18/2007  . Lichenification and lichen simplex chronicus 07/18/2007  . Unspecified urinary incontinence 07/18/2007  . Leukoplakia of oral mucosa, including tongue 01/01/2007  . Major depressive disorder, recurrent episode, severe, without mention of psychotic behavior 12/18/2006  . Alzheimer's disease 04/03/2006  . Diverticulosis of colon (without mention of hemorrhage) 02/20/2006  . Unspecified constipation 01/10/2006  . Heartburn 01/10/2006  . Unspecified disease of pericardium 01/01/2206  . Type II or unspecified type diabetes mellitus without mention of complication, uncontrolled 06/13/2005  . Edema 12/20/2004  . Postherpetic polyneuropathy 10/20/2004  . Lumbago 08/18/2004  . Muscle weakness (generalized) 01/16/2004  . Dizziness and giddiness 09/24/2003  . Insomnia, unspecified 09/24/2003  . Pain in limb 09/03/2003  . Pure hypercholesterolemia 07/16/2003  . Anxiety state, unspecified 07/16/2003  . Macular degeneration (senile) of  retina, unspecified 07/16/2003  . Coronary atherosclerosis of unspecified type of vessel, native or graft 07/16/2003  . Acute, but ill-defined, cerebrovascular disease 07/16/2003  . Chronic airway obstruction, not elsewhere classified 07/16/2003  .  Esophageal reflux 07/16/2003  . Osteoarthrosis, unspecified whether generalized or localized, unspecified site 07/16/2003  . Displacement of cervical intervertebral disc without myelopathy 07/16/2003  . Osteoporosis, unspecified 07/16/2003  . Unspecified hearing loss 03/11/2003  . Unspecified essential hypertension 07/03/2002  . Other specified disease of sebaceous glands 04/15/2002  . Laceration of forehead 09/22/13  . History of fall 09/22/13  . Cardiomegaly 09/22/13    Patient Active Problem List   Diagnosis Date Noted  . Leukocytosis 09/18/2014  . Hypoxia 01/16/2014  . Laceration of forehead 09/22/2013  . History of fall 09/22/2013  . Advanced care planning/counseling discussion 05/10/2013  . HCAP (healthcare-associated pneumonia) 02/22/2013  . Dysphagia, oropharyngeal phase 04/09/2010  . Abnormality of gait 08/24/2009  . Diabetes 08/18/2008  . Major depressive disorder, recurrent episode, severe 12/18/2006  . Alzheimer's disease 04/03/2006  . Constipation 01/10/2006  . Diabetes type 2, controlled 06/13/2005  . Macular degeneration (senile) of retina, unspecified 07/16/2003  . Unspecified hearing loss 03/11/2003  . Essential hypertension 07/03/2002    Allergies  Allergen Reactions  . Alprazolam   . Bupropion   . Ciprocinonide [Fluocinolone]   . Codeine   . Halcion [Triazolam]   . Nortriptyline   . Phosphate   . Septra [Sulfamethoxazole-Trimethoprim]   . Sulfa Antibiotics   . Ultram [Tramadol]     Medications: Patient's Medications  New Prescriptions   No medications on file  Previous Medications   AMLODIPINE (NORVASC) 10 MG TABLET    Take 10 mg by mouth daily. Take 1 tablet daily to treat high blood pressure.   ASPIRIN EC 81 MG TABLET    Take 1 tablet (81 mg total) by mouth daily.   DIAZEPAM (VALIUM) 5 MG TABLET    Take 0.5 tablets (2.5 mg total) by mouth every 6 (six) hours as needed for anxiety.   LACTOSE FREE NUTRITION (BOOST PLUS) LIQD    Take 237 mLs by  mouth. One serving once a day   MEMANTINE HCL ER (NAMENDA XR) 14 MG CP24    Take 1 capsule by mouth daily.   MIRTAZAPINE (REMERON) 15 MG TABLET    Take 15 mg by mouth at bedtime.   PAROXETINE (PAXIL) 40 MG TABLET    Take 40 mg by mouth every morning. Give 1 daily to treat depression/anxiety.   POLYETHYLENE GLYCOL (MIRALAX / GLYCOLAX) PACKET    Take 17 g by mouth daily.   POLYVINYL ALCOHOL (LIQUIFILM TEARS) 1.4 % OPHTHALMIC SOLUTION    1 drop. One drop both eyes twice daily as needed for itchy eyes, every 12 hours as needed   RIVASTIGMINE (EXELON) 9.5 MG/24HR    Place 1 patch (9.5 mg total) onto the skin daily.   SENNA (SENOKOT) 8.6 MG TABLET    Take 1 tablet by mouth daily. Take 1 every night for laxative - hold for loose stool.   SODIUM CHLORIDE (OCEAN) 0.65 % NASAL SPRAY    Place 1 spray into the nose. Give one spray nasal twice a day  Modified Medications   No medications on file  Discontinued Medications   No medications on file    Physical Exam: Filed Vitals:   09/23/14 1638  BP: 120/55  Pulse: 68  Temp: 97.1 F (36.2 C)  Resp: 16  Weight: 121 lb 8 oz (55.112 kg)  SpO2: 95%   Body mass index is 23.73 kg/(m^2).  Physical Exam  Constitutional: No distress.  White female had been resting in bed, did sit up with assistance, wearing oxygen  Cardiovascular: Normal rate, regular rhythm, normal heart sounds and intact distal pulses.   Pulmonary/Chest: Effort normal. She has no wheezes.  rhonchorous lung sounds (unchanged)  Abdominal: Soft. Bowel sounds are normal. She exhibits distension. She exhibits no mass. There is no tenderness. There is no rebound and no guarding.  Neurological:  Speech garbled, short responses could be understood only  Skin: Skin is warm and dry.    Labs reviewed: Basic Metabolic Panel:  Recent Labs  03/01/70 04/01/14 08/12/14  NA 135* 140 141  K 4.1 4.9 4.6  BUN 8 19 16   CREATININE 0.5 0.8 0.8    Liver Function Tests:  Recent Labs  08/12/14    AST 19  ALT 11  ALKPHOS 106    CBC:  Recent Labs  09/24/13 0110  10/03/13 04/01/14 04/08/14 08/12/14  WBC 12.1*  < > 13.3 11.4 13.2 10.0  HGB 14.2  --  10.6*  --  13.1 13.3  HCT 43.0  --  32*  --  39 40  MCV 95.1  --   --   --   --   --   PLT 426*  --  595* 534* 498* 485*  < > = values in this interval not displayed.  Lab Results  Component Value Date   TSH 2.17 03/25/2014   Lab Results  Component Value Date   HGBA1C 7.0* 10/03/2013   No results found for: CHOL, HDL, LDLCALC, LDLDIRECT, TRIG, CHOLHDL  Patient Care Team: Kermit Balo, DO as PCP - General (Geriatric Medicine) [facility]  Assessment/Plan 1. Disinhibition behavior -I was initially told that this was new so I ordered a urinalysis with c+s via I/O cath to evaluate; however, I later learned from memory unit nursing that this has been ongoing, but worse -suspect this is just a phase of her dementia -she is already on Paxil for her depression which has been known to help -cont paxil, exelon patch, namenda XR which should help with these behaviors -consider trying gabapentin, cimetidine if her outbursts continue and cannot be managed conservatively with redirection  2. Alzheimer's disease -cont exelon patch and namenda XR  3. Diabetes type 2, controlled -cont diet only and monitor Lab Results  Component Value Date   HGBA1C 7.0* 10/03/2013  -should reassess hba1c next routine, but doubt it will be elevated with decreased intake and weight loss  4. Loose stools -decrease miralax to every other day and cont to monitor  Family/ staff Communication: discussed with nursing and nursing manager  Labs/tests ordered:  UA c+s  Kahealani Yankovich L. Juliocesar Blasius, D.O. Geriatrics [facility] [address], [place] [address] Cell Phone (Mon-Fri 8am-5pm):  [phone] On Call:  [phone] & follow prompts after 5pm & weekends Office Phone:  [phone] Office  Fax:  [phone]

## 2014-10-07 ENCOUNTER — Non-Acute Institutional Stay (SKILLED_NURSING_FACILITY): Payer: Medicare Other | Admitting: Internal Medicine

## 2014-10-07 ENCOUNTER — Encounter: Payer: Self-pay | Admitting: Internal Medicine

## 2014-10-07 DIAGNOSIS — G309 Alzheimer's disease, unspecified: Secondary | ICD-10-CM

## 2014-10-07 DIAGNOSIS — F0280 Dementia in other diseases classified elsewhere without behavioral disturbance: Secondary | ICD-10-CM

## 2014-10-07 DIAGNOSIS — R0902 Hypoxemia: Secondary | ICD-10-CM | POA: Diagnosis not present

## 2014-10-07 DIAGNOSIS — F332 Major depressive disorder, recurrent severe without psychotic features: Secondary | ICD-10-CM

## 2014-10-07 DIAGNOSIS — R5383 Other fatigue: Secondary | ICD-10-CM

## 2014-10-07 NOTE — Progress Notes (Signed)
Patient ID: [name], female   DOB: 22-Apr-1915, 81 y.o.   MRN: [id number]  Location:  [facility] Provider:  Gwenith Spitz. Renato Gails, D.O., C.M.D.  Code Status: FULL CODE Goals of care: Advanced Directive information Does patient have an advance directive?: Yes, Type of Advance Directive: Healthcare Power of [place];Living will (3 children are HCPOAs--her daughter agrees with DNR, but two sons want mom to be full code), Does patient want to make changes to advanced directive?: No - Patient declined  Chief Complaint  Patient presents with  . Acute Visit    lethargic, less arousable, low grade temp, less talkative, gray appearance, "not herself" for 2 days    HPI:  81 yo white female long term memory care unit resident seen for AV due to change in mental status.  Her daughter is still out of town and staff suspect she is behaving differently due to this change.  She had a urine dipstick done 7/26 due to worsened disinhibition, but it was completely negative.  Her lungs are clear and sats on her routine oxygen have been 99%.  She has had a low grade temp.  She's moving her bowels and has no swelling of her extremities.  Earlier, notes indicate she was removed from the dining area for saying "I want to fuck" over and over.  Review of Systems: She said only 2 incomprehensible words to me and grabbed my hand, held it tight, could not follow commands for me but was awake.  History obtained from nursing staff, DNS.Review of Systems  Unable to perform ROS: dementia    Past Medical History  Diagnosis Date  . Encounter for long-term (current) use of other medications 09/06/2011  . Closed fracture of other bone of wrist 03/15/2011  . Retention of urine, unspecified 02/24/2011  . Dysphagia, oropharyngeal phase 04/09/2010  . Pneumonitis due to inhalation of food or vomitus 04/06/2010  . Abnormality of gait 08/24/2009  . Iron deficiency anemia, unspecified 07/29/2009  . Personal  history of fall 07/29/2009  . Pain in joint, lower leg 06/17/2009  . Type II or unspecified type diabetes mellitus without mention of complication, not stated as uncontrolled 08/18/2008  . Rickets, active 02/08/2008  . Allergic rhinitis due to pollen 07/18/2007  . Lichenification and lichen simplex chronicus 07/18/2007  . Unspecified urinary incontinence 07/18/2007  . Leukoplakia of oral mucosa, including tongue 01/01/2007  . Major depressive disorder, recurrent episode, severe, without mention of psychotic behavior 12/18/2006  . Alzheimer's disease 04/03/2006  . Diverticulosis of colon (without mention of hemorrhage) 02/20/2006  . Unspecified constipation 01/10/2006  . Heartburn 01/10/2006  . Unspecified disease of pericardium 01/01/2206  . Type II or unspecified type diabetes mellitus without mention of complication, uncontrolled 06/13/2005  . Edema 12/20/2004  . Postherpetic polyneuropathy 10/20/2004  . Lumbago 08/18/2004  . Muscle weakness (generalized) 01/16/2004  . Dizziness and giddiness 09/24/2003  . Insomnia, unspecified 09/24/2003  . Pain in limb 09/03/2003  . Pure hypercholesterolemia 07/16/2003  . Anxiety state, unspecified 07/16/2003  . Macular degeneration (senile) of retina, unspecified 07/16/2003  . Coronary atherosclerosis of unspecified type of vessel, native or graft 07/16/2003  . Acute, but ill-defined, cerebrovascular disease 07/16/2003  . Chronic airway obstruction, not elsewhere classified 07/16/2003  . Esophageal reflux 07/16/2003  . Osteoarthrosis, unspecified whether generalized or localized, unspecified site 07/16/2003  . Displacement of cervical intervertebral disc without myelopathy 07/16/2003  . Osteoporosis, unspecified 07/16/2003  . Unspecified hearing loss 03/11/2003  . Unspecified essential hypertension 07/03/2002  .  Other specified disease of sebaceous glands 04/15/2002  . Laceration of forehead 09/22/13  . History of fall 09/22/13  . Cardiomegaly  09/22/13    Patient Active Problem List   Diagnosis Date Noted  . Leukocytosis 09/18/2014  . Hypoxia 01/16/2014  . Laceration of forehead 09/22/2013  . History of fall 09/22/2013  . Advanced care planning/counseling discussion 05/10/2013  . HCAP (healthcare-associated pneumonia) 02/22/2013  . Dysphagia, oropharyngeal phase 04/09/2010  . Abnormality of gait 08/24/2009  . Diabetes 08/18/2008  . Major depressive disorder, recurrent episode, severe 12/18/2006  . Alzheimer's disease 04/03/2006  . Constipation 01/10/2006  . Diabetes type 2, controlled 06/13/2005  . Macular degeneration (senile) of retina, unspecified 07/16/2003  . Unspecified hearing loss 03/11/2003  . Essential hypertension 07/03/2002    Allergies  Allergen Reactions  . Alprazolam   . Bupropion   . Ciprocinonide [Fluocinolone]   . Codeine   . Halcion [Triazolam]   . Nortriptyline   . Phosphate   . Septra [Sulfamethoxazole-Trimethoprim]   . Sulfa Antibiotics   . Ultram [Tramadol]     Medications: Patient's Medications  New Prescriptions   No medications on file  Previous Medications   AMLODIPINE (NORVASC) 10 MG TABLET    Take 10 mg by mouth daily. Take 1 tablet daily to treat high blood pressure.   ASPIRIN EC 81 MG TABLET    Take 1 tablet (81 mg total) by mouth daily.   DIAZEPAM (VALIUM) 5 MG TABLET    Take 0.5 tablets (2.5 mg total) by mouth every 6 (six) hours as needed for anxiety.   LACTOSE FREE NUTRITION (BOOST PLUS) LIQD    Take 237 mLs by mouth. One serving once a day   MEMANTINE HCL ER (NAMENDA XR) 14 MG CP24    Take 1 capsule by mouth daily.   MIRTAZAPINE (REMERON) 15 MG TABLET    Take 15 mg by mouth at bedtime.   PAROXETINE (PAXIL) 40 MG TABLET    Take 40 mg by mouth every morning. Give 1 daily to treat depression/anxiety.   POLYETHYLENE GLYCOL (MIRALAX / GLYCOLAX) PACKET    Take 17 g by mouth daily.   POLYVINYL ALCOHOL (LIQUIFILM TEARS) 1.4 % OPHTHALMIC SOLUTION    1 drop. One drop both eyes  twice daily as needed for itchy eyes, every 12 hours as needed   RIVASTIGMINE (EXELON) 9.5 MG/24HR    Place 1 patch (9.5 mg total) onto the skin daily.   SENNA (SENOKOT) 8.6 MG TABLET    Take 1 tablet by mouth daily. Take 1 every night for laxative - hold for loose stool.   SODIUM CHLORIDE (OCEAN) 0.65 % NASAL SPRAY    Place 1 spray into the nose. Give one spray nasal twice a day  Modified Medications   No medications on file  Discontinued Medications   No medications on file    Physical Exam: Filed Vitals:   10/07/14 1621  BP: 130/58  Pulse: 70  Temp: 99.2 F (37.3 C)  Resp: 18  Weight: 119 lb (53.978 kg)  SpO2: 99%   Body mass index is 23.24 kg/(m^2).  Physical Exam  Constitutional:  Chronically ill appearing white female resting on her side in bed, wearing her oxygen  Cardiovascular: Normal rate, regular rhythm, normal heart sounds and intact distal pulses.   Pulmonary/Chest: Effort normal and breath sounds normal. No respiratory distress.  No wheezes or rhonchi   Abdominal: Soft. Bowel sounds are normal. She exhibits no distension. There is no tenderness.  Musculoskeletal: She  exhibits no edema.  Neurological:  I was able to awaken her, but she did not follow commands, only spoke two words that were incomprehensible to me  Skin: Skin is warm and dry.  Several ecchymoses of arms   Psychiatric:  Appears depressed    Labs reviewed: Basic Metabolic Panel:  Recent Labs  16/10/96 08/12/14  NA 140 141  K 4.9 4.6  BUN 19 16  CREATININE 0.8 0.8    Liver Function Tests:  Recent Labs  08/12/14  AST 19  ALT 11  ALKPHOS 106    CBC:  Recent Labs  04/01/14 04/08/14 08/12/14  WBC 11.4 13.2 10.0  HGB  --  13.1 13.3  HCT  --  39 40  PLT 534* 498* 485*    Lab Results  Component Value Date   TSH 2.17 03/25/2014   Lab Results  Component Value Date   HGBA1C 7.0* 10/03/2013  09/23/14:  UA negative for UTI   Patient Care Team: Kermit Balo, DO as PCP -  General (Geriatric Medicine) [facility]  Assessment/Plan 1. Lethargy -with skin discoloration -cause unclear -she has end stage dementia and probable emphysema from years of smoking (never had PFTs)--on chronic O2 -has had low grade temp w/o any localizing symptoms -did not seem very different aside from the skin color when I saw her and vitals are otherwise within nl limits -UA previously negative and no symptoms/signs to localize -lungs clear -my suspicion is poor intake due to depression while her daughter is away--DON suggesting getting her on the phone and putting the phone up to her ear--seems like this might be helpful as no other clear cause of her change is notable -pt does not tolerate lab draws well--distresses her and causes agitation and combativeness so prefer to avoid putting her through that  2. Alzheimer's disease -end stage with behaviors -recommendation for comfort care due to limited quality of life and overall poor prognosis, but her sons have not been willing to take that step  3. Hypoxia -cont chronic O2, sats have not changed  4. Major depressive disorder, recurrent, severe without psychotic features -suspect this is the cause of her changes--try to get some phone time with her daughter to brighten her spirits  Family/ staff Communication: discussed with nursing staff in [address] and DON  Labs/tests ordered:  No new testing  Darletta Noblett L. Antonio Creswell, D.O. Geriatrics [facility] [address], [place] [address] Cell Phone (Mon-Fri 8am-5pm):  [phone] On Call:  [phone] & follow prompts after 5pm & weekends Office Phone:  [phone] Office Fax:  [phone]

## 2014-10-13 ENCOUNTER — Non-Acute Institutional Stay (SKILLED_NURSING_FACILITY): Payer: Medicare Other | Admitting: Adult Health

## 2014-10-13 ENCOUNTER — Encounter: Payer: Self-pay | Admitting: Adult Health

## 2014-10-13 DIAGNOSIS — J189 Pneumonia, unspecified organism: Secondary | ICD-10-CM | POA: Diagnosis not present

## 2014-10-13 DIAGNOSIS — G308 Other Alzheimer's disease: Secondary | ICD-10-CM | POA: Diagnosis not present

## 2014-10-13 DIAGNOSIS — E119 Type 2 diabetes mellitus without complications: Secondary | ICD-10-CM

## 2014-10-13 DIAGNOSIS — G309 Alzheimer's disease, unspecified: Secondary | ICD-10-CM

## 2014-10-13 DIAGNOSIS — I10 Essential (primary) hypertension: Secondary | ICD-10-CM

## 2014-10-13 DIAGNOSIS — F0281 Dementia in other diseases classified elsewhere with behavioral disturbance: Secondary | ICD-10-CM

## 2014-10-13 DIAGNOSIS — D72829 Elevated white blood cell count, unspecified: Secondary | ICD-10-CM | POA: Diagnosis not present

## 2014-10-13 LAB — BASIC METABOLIC PANEL
BUN: 19 mg/dL (ref 4–21)
CREATININE: 0.8 mg/dL (ref 0.5–1.1)
GLUCOSE: 215 mg/dL
POTASSIUM: 5.4 mmol/L — AB (ref 3.4–5.3)
Sodium: 139 mmol/L (ref 137–147)

## 2014-10-13 LAB — CBC AND DIFFERENTIAL
HCT: 39 % (ref 36–46)
Hemoglobin: 13.2 g/dL (ref 12.0–16.0)
Neutrophils Absolute: 39 /uL
PLATELETS: 682 10*3/uL — AB (ref 150–399)
WBC: 17 10*3/mL

## 2014-10-13 NOTE — Progress Notes (Signed)
Patient ID: [name], female   DOB: [birth year], 81 y.o.   MRN: [id number]    Nursing Home Location:  [name]  Patient Care Team: Emily Balo, DO as PCP - General (Geriatric Medicine) [name]  Place of Service: SNF [phone])  Chief Complaint  Patient presents with  . Acute Visit    decreased 02 sat    HPI:  This is a 81 y.o. female residing at [name]. She has a hx of HTN, advanced AD, hypoxia, dysphagia, depression,COPD,  and DM2.  I was asked to see her today due to low oxygen levels in the 70's over the weekend. She was afebrile without cough or sputum production but did have some lethargy, pallor, and weakness. She previously used 02 intermittently due to hx of COPD and smoking (no PFTs).  She is currently dependent on 2 L.   The nurse reports that she drank a boost for breakfast but had a very little from her tray (which is not unusal).   She had an xray of her chest that showed the following  10/12/2014: findings likely represent bilateral pleural effusions, right greater than left. This may represent a mild degree of pulmonary edema  She has a hx of grade 1 diastolic dysfxn.    Functional status: Walker for ambulation.    Review of Systems:  Review of Systems  Unable to perform ROS: Dementia    Medications: Patient's Medications  New Prescriptions   No medications on file  Previous Medications   ASPIRIN EC 81 MG TABLET    Take 1 tablet (81 mg total) by mouth daily.   DIAZEPAM (VALIUM) 5 MG TABLET    Take 0.5 tablets (2.5 mg total) by mouth every 6 (six) hours as needed for anxiety.   LACTOSE FREE NUTRITION (BOOST PLUS) LIQD    Take 237 mLs by mouth. One serving once a day   MEMANTINE HCL ER (NAMENDA XR) 14 MG CP24    Take 1 capsule by mouth daily.   MIRTAZAPINE (REMERON) 15 MG TABLET    Take 15 mg by mouth at bedtime.   PAROXETINE (PAXIL) 40 MG TABLET    Take 40 mg by mouth every  morning. Give 1 daily to treat depression/anxiety.   POLYETHYLENE GLYCOL (MIRALAX / GLYCOLAX) PACKET    Take 17 g by mouth daily.   POLYVINYL ALCOHOL (LIQUIFILM TEARS) 1.4 % OPHTHALMIC SOLUTION    1 drop. One drop both eyes twice daily as needed for itchy eyes, every 12 hours as needed   RIVASTIGMINE (EXELON) 9.5 MG/24HR    Place 1 patch (9.5 mg total) onto the skin daily.   SENNA (SENOKOT) 8.6 MG TABLET    Take 1 tablet by mouth daily. Take 1 every night for laxative - hold for loose stool.   SODIUM CHLORIDE (OCEAN) 0.65 % NASAL SPRAY    Place 1 spray into the nose. Give one spray nasal twice a day  Modified Medications   No medications on file  Discontinued Medications   AMLODIPINE (NORVASC) 10 MG TABLET    Take 10 mg by mouth daily. Take 1 tablet daily to treat high blood pressure.     Physical Exam:  Filed Vitals:   10/13/14 1107  BP: 105/59  Pulse: 87  Temp: 97.4 F (36.3 C)  Resp: 17   Wt Readings from Last 3 Encounters:  10/07/14 119 lb (53.978 kg)  09/23/14 121 lb 8 oz (55.112 kg)  09/18/14 121 lb  8 oz (55.112 kg)    Physical Exam  Constitutional:  Frail, elderly in poor condition.  HENT:  Head: Normocephalic.  Severe hearing loss  Eyes: Conjunctivae are normal. Pupils are equal, round, and reactive to light.  Neck: No JVD present. No tracheal deviation present. No thyromegaly present.  Cardiovascular: Normal rate and regular rhythm.  Exam reveals no friction rub.   No murmur heard. 3/ 6 SEM  Pulmonary/Chest: Breath sounds normal. She has no wheezes. She has no rales. She exhibits no tenderness.  Increased WOB, cough during exam  Abdominal: She exhibits no distension and no mass. There is no tenderness. There is no rebound.  Musculoskeletal: Normal range of motion. She exhibits no edema or tenderness.  Lymphadenopathy:    She has no cervical adenopathy.  Neurological: No cranial nerve deficit.  Not able to follow commands, no obvious focal deficit.  Lethargic  but opens eyes  Skin: No rash noted. No erythema. No pallor.  Psychiatric:  Flat affect    Labs reviewed/Significant Diagnostic Results:  Basic Metabolic Panel:  Recent Labs  16/10/96 08/12/14 10/13/14  NA 140 141 139  K 4.9 4.6 5.4*  BUN CREATININE 0.8 0.8 0.8   Liver Function Tests:  Recent Labs  08/12/14  AST 19  ALT 11  ALKPHOS 106   No results for input(s): LIPASE, AMYLASE in the last 8760 hours. No results for input(s): AMMONIA in the last 8760 hours. CBC:  Recent Labs  04/08/14 08/12/14 10/13/14  WBC 13.2 10.0 17.0  NEUTROABS  --   --  39  HGB 13.1 13.3 13.2  HCT 39 40 39  PLT 498* 485* 682*   CBG: No results for input(s): GLUCAP in the last 8760 hours. TSH:  Recent Labs  03/25/14  TSH 2.17   A1C: Lab Results  Component Value Date   HGBA1C 7.0* 10/03/2013   03/25/14: Vit D 32 04/15/14: PCXR Interstitial markings, likely peribronchial wall thickening due to chronic inflammation or interstitial lung disease 10/12/2014: findings likely represent bilateral pleural effusions, right greater than left. This may represent a mild degree of pulmonary edema  Assessment/Plan  1. Healthcare-associated pneumonia -Given the cough noted on exam with decreased oxygen levels and elevated 02 sats, her hx of pna, will begin Levaquin 500 mg daily for 7 days with Florastor 1 cap BID for 10 days -continue Duonebs q 6 hrs -titrate 02 for sats 88-90% -there may be some component of CHF but she is non edematous at this time had has received 2 dose of lasix 20 mg with clear lung sounds -a better quality xray may be more revealing but her family does not wish to send her to the hospital and they have made her a DNR -she remains lethargic with an increased WOB and hypoxia, poor prognosis noted to daughter - I reviewed her plan of care with her daughter, [name]  2. Essential hypertension -borderline low BP with mild pulmonary edema on xray, will d/c norvasc  3.  Alzheimer's dementia with behavioral disturbance -advanced, continue current meds, may hold valium for lethargy  4. Leukocytosis -chronic issue not worked up due age and debility -worsening due to #1  5. DM II -check CBG daily, elevated on BMP at 205  F/U BMP, CBC on Thursday 8/18   [name] Ley, ANP [facility] [phone]

## 2014-10-30 DEATH — deceased

## 2015-01-08 NOTE — Progress Notes (Signed)
This encounter was created in error - please disregard.

## 2015-02-10 NOTE — Progress Notes (Signed)
This encounter was created in error - please disregard.

## 2015-12-08 IMAGING — CT CT CERVICAL SPINE W/O CM
2 of 4 series · 5 of 14 positions shown, 6 images · non-contrast
Comparison: None.

CLINICAL DATA: Fell tonight with laceration to the right forehead.
No loss of consciousness.

EXAM:
CT HEAD WITHOUT CONTRAST
CT CERVICAL SPINE WITHOUT CONTRAST
TECHNIQUE: Multidetector CT imaging of the head and cervical spine was
performed following the standard protocol without intravenous
contrast. Multiplanar CT image reconstructions of the cervical spine
were also generated.

[Series 5: c-spine st · axial · 0.28mm/px · z∈[+1244,+1294]mm · 2 of 76 slices shown]
[im 26/76  bone]
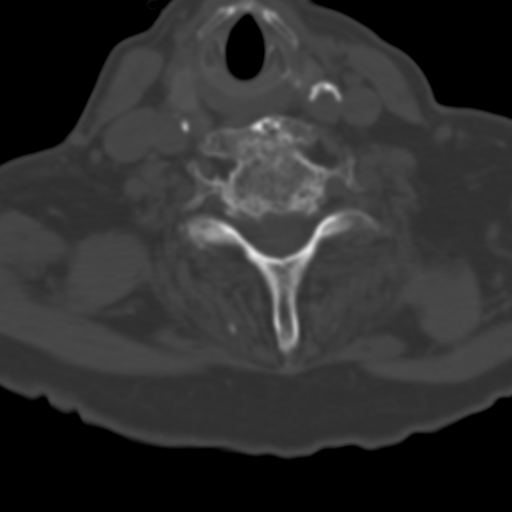
[im 51/76  bone]
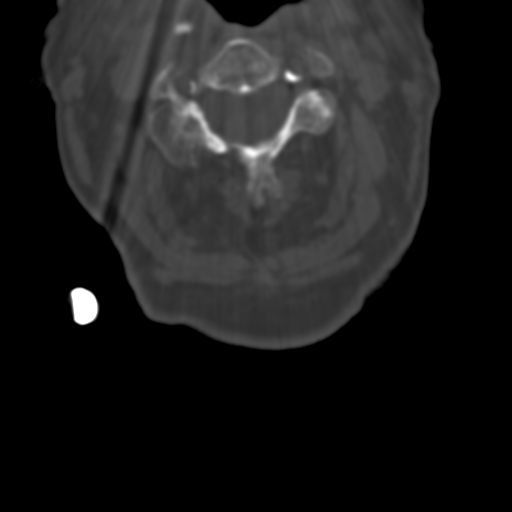

[Series 7: axial recon · axial · 0.16mm/px · z∈[+1207,+1280]mm · 3 of 82 slices shown, 4 images]
[im 21/82  soft-tissue]
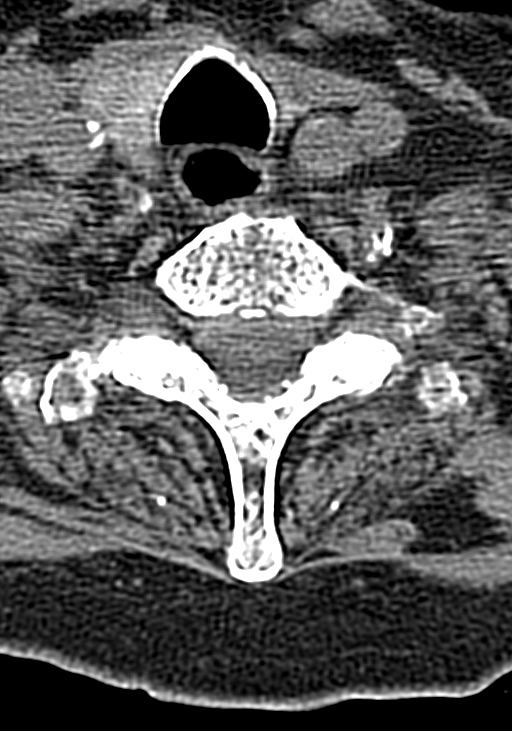
[im 21/82  bone]
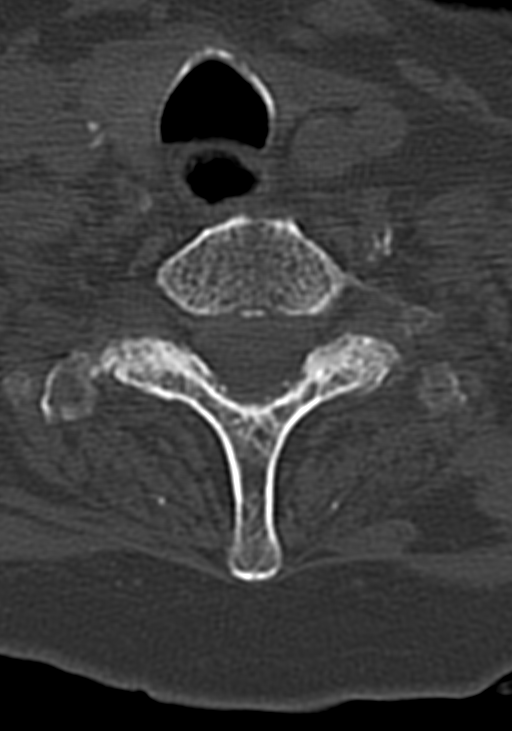
[im 41/82  bone]
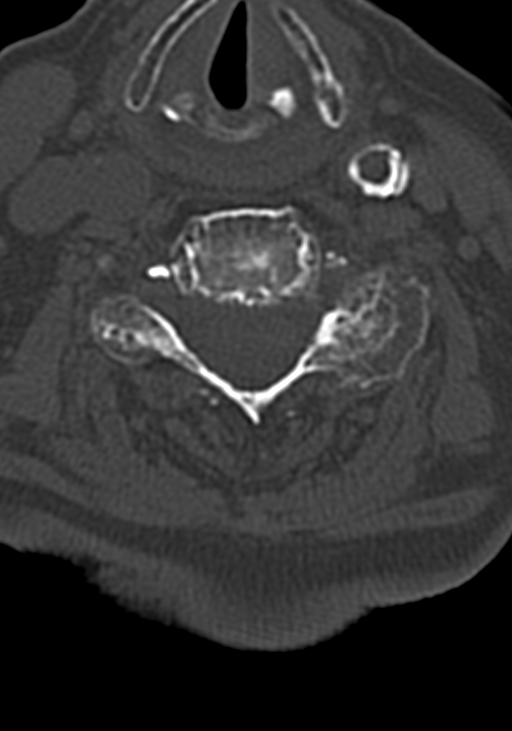
[im 61/82  bone]
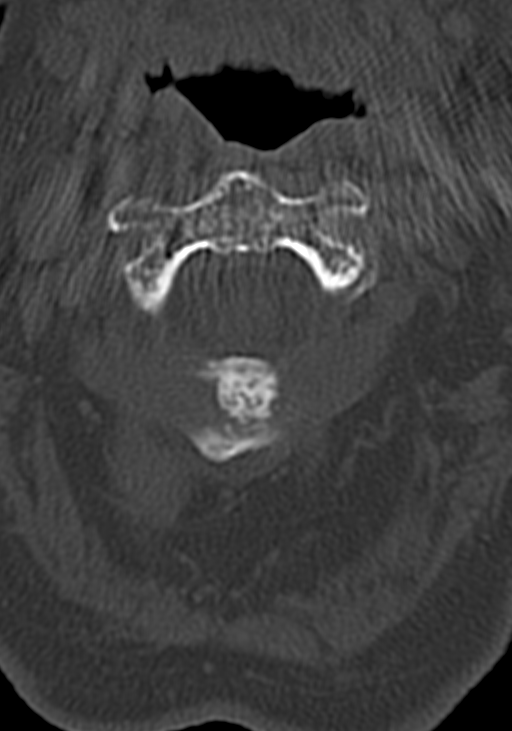

[5 of 14 positions shown; findings below may reference images not displayed]

FINDINGS: CT HEAD FINDINGS

Diffuse cerebral atrophy. Ventricular dilatation most likely due to
central atrophy. Extensive with low-attenuation changes throughout
the deep white matter consistent with small vessel ischemic changes.
Areas of encephalomalacia extending to the cortical surfaces in both
frontal and parietal regions as well as in the cerebellum consistent
with old infarcts. Extensive vascular calcifications. No mass effect
or midline shift. No abnormal extra-axial fluid collections.
Gray-white matter junctions are distinct. Basal cisterns are not
effaced. No evidence of acute intracranial hemorrhage. No depressed
skull fractures. Mucosal thickening or retention cysts in the
frontal sinuses. No acute air-fluid levels. Subcutaneous scalp
laceration over the right anterior frontal region.

CT CERVICAL SPINE FINDINGS

Diffuse bone demineralization. Straightening of the usual cervical
lordosis which may be due to patient positioning or degenerative
changes. However, ligamentous injury or muscle spasm could also have
this appearance in are not excluded. Slight anterior subluxation of
C3 on C4 is likely to be degenerative. Prominent degenerative
changes throughout the facet joints with coalition of C2 through C4
posterior elements. Prominent hypertrophic changes demonstrated at
C5-6 and C6-7 levels with prominent disc osteophyte complexes at
C5-6. No vertebral compression deformities. No prevertebral soft
tissue swelling. C1-2 articulation appears intact with prominent
degenerative narrowing present. Vascular calcifications in the
cervical carotid and vertebral arteries. Emphysematous changes in
the lung apices. Calcified granuloma in the left upper lung.
IMPRESSION: No acute intracranial abnormalities. Chronic atrophy and small
vessel ischemic changes with probable old infarcts bilaterally.

Diffuse degenerative change throughout the cervical spine.
Nonspecific straightening of the usual cervical lordosis. No
displaced fractures identified.
